# Patient Record
Sex: Female | Born: 1990 | Race: Black or African American | Hispanic: No | Marital: Married | Smoking: Current every day smoker
Health system: Southern US, Community
[De-identification: ages and names within clinical notes are randomized; demographics above are authoritative.]

## PROBLEM LIST (undated history)

## (undated) ENCOUNTER — Inpatient Hospital Stay (HOSPITAL_COMMUNITY): Payer: Self-pay

## (undated) DIAGNOSIS — O139 Gestational [pregnancy-induced] hypertension without significant proteinuria, unspecified trimester: Secondary | ICD-10-CM

## (undated) DIAGNOSIS — K219 Gastro-esophageal reflux disease without esophagitis: Secondary | ICD-10-CM

## (undated) HISTORY — PX: OTHER SURGICAL HISTORY: SHX169

## (undated) HISTORY — PX: NO PAST SURGERIES: SHX2092

---

## 2014-03-06 ENCOUNTER — Encounter (HOSPITAL_COMMUNITY): Payer: Self-pay | Admitting: Emergency Medicine

## 2014-03-06 ENCOUNTER — Emergency Department (HOSPITAL_COMMUNITY)
Admission: EM | Admit: 2014-03-06 | Discharge: 2014-03-06 | Disposition: A | Discharge status: Home / Self Care | Payer: Self-pay | Attending: Emergency Medicine | Admitting: Emergency Medicine

## 2014-03-06 DIAGNOSIS — Z3202 Encounter for pregnancy test, result negative: Secondary | ICD-10-CM | POA: Insufficient documentation

## 2014-03-06 DIAGNOSIS — B9689 Other specified bacterial agents as the cause of diseases classified elsewhere: Secondary | ICD-10-CM | POA: Insufficient documentation

## 2014-03-06 DIAGNOSIS — A499 Bacterial infection, unspecified: Secondary | ICD-10-CM | POA: Insufficient documentation

## 2014-03-06 DIAGNOSIS — N760 Acute vaginitis: Secondary | ICD-10-CM | POA: Insufficient documentation

## 2014-03-06 DIAGNOSIS — N949 Unspecified condition associated with female genital organs and menstrual cycle: Secondary | ICD-10-CM | POA: Insufficient documentation

## 2014-03-06 DIAGNOSIS — R109 Unspecified abdominal pain: Secondary | ICD-10-CM | POA: Insufficient documentation

## 2014-03-06 DIAGNOSIS — N938 Other specified abnormal uterine and vaginal bleeding: Secondary | ICD-10-CM | POA: Insufficient documentation

## 2014-03-06 LAB — URINALYSIS, ROUTINE W REFLEX MICROSCOPIC
BILIRUBIN URINE: NEGATIVE
Glucose, UA: NEGATIVE mg/dL
Ketones, ur: NEGATIVE mg/dL
Leukocytes, UA: NEGATIVE
Nitrite: NEGATIVE
Protein, ur: NEGATIVE mg/dL
SPECIFIC GRAVITY, URINE: 1.024 (ref 1.005–1.030)
UROBILINOGEN UA: 0.2 mg/dL (ref 0.0–1.0)
pH: 6.5 (ref 5.0–8.0)

## 2014-03-06 LAB — WET PREP, GENITAL
TRICH WET PREP: NONE SEEN
WBC, Wet Prep HPF POC: NONE SEEN
Yeast Wet Prep HPF POC: NONE SEEN

## 2014-03-06 LAB — URINE MICROSCOPIC-ADD ON

## 2014-03-06 LAB — POC URINE PREG, ED: PREG TEST UR: NEGATIVE

## 2014-03-06 MED ORDER — METRONIDAZOLE 500 MG PO TABS: 500.0000 mg | ORAL_TABLET | Freq: Two times a day (BID) | ORAL | Status: DC

## 2014-03-06 NOTE — ED Provider Notes (Signed)
CSN: [id number]     Arrival date & time 03/06/14  1805 History   First MD Initiated Contact with Patient 03/06/14 2224     Chief Complaint  Patient presents with  . Abdominal Cramping    HPI Patient reports to social uterine bleeding over the past several weeks.  She's concerned that she could be pregnant.  She also reports new vaginal discharge and pain with intercourse over the past week.  No new sexual partners.  She has unprotected sex.  Denies fevers and chills.  Denies nausea vomiting diarrhea.  Symptoms are mild in severity.  Nothing worsens or improves her symptoms   History reviewed. No pertinent past medical history. History reviewed. No pertinent past surgical history. No family history on file. History  Substance Use Topics  . Smoking status: Never Smoker   . Smokeless tobacco: Not on file  . Alcohol Use: No   OB History   Grav Para Term Preterm Abortions TAB SAB Ect Mult Living   1    1     2      Review of Systems  All other systems reviewed and are negative.     Allergies  Review of patient's allergies indicates no known allergies.  Home Medications   Prior to Admission medications   Medication Sig Start Date End Date Taking? Authorizing Provider  metroNIDAZOLE (FLAGYL) 500 MG tablet Take 1 tablet (500 mg total) by mouth 2 (two) times daily. 03/06/14   Lyanne CoKevin M Cortez Steelman, MD   BP 127/64  Pulse 69  Temp(Src) 98.2 F (36.8 C) (Oral)  Resp 15  Ht 5\' 5"  (1.651 m)  Wt 120 lb (54.432 kg)  BMI 19.97 kg/m2  SpO2 100%  LMP 02/12/2014 Physical Exam  Nursing note and vitals reviewed. Constitutional: She is oriented to person, place, and time. She appears well-developed and well-nourished. No distress.  HENT:  Head: Normocephalic and atraumatic.  Eyes: EOM are normal.  Neck: Normal range of motion.  Cardiovascular: Normal rate, regular rhythm and normal heart sounds.   Pulmonary/Chest: Effort normal and breath sounds normal.  Abdominal: Soft. She exhibits no  distension. There is no tenderness.  Genitourinary:  Normal external genitalia.  No cervical motion tenderness.  No adnexal masses or fullness.  Scant vaginal discharge.  No vaginal bleeding noted  Musculoskeletal: Normal range of motion.  Neurological: She is alert and oriented to person, place, and time.  Skin: Skin is warm and dry.  Psychiatric: She has a normal mood and affect. Judgment normal.    ED Course  Procedures (including critical care time) Labs Review Labs Reviewed  WET PREP, GENITAL - Abnormal; Notable for the following:    Clue Cells Wet Prep HPF POC RARE (*)    All other components within normal limits  URINALYSIS, ROUTINE W REFLEX MICROSCOPIC - Abnormal; Notable for the following:    APPearance CLOUDY (*)    Hgb urine dipstick LARGE (*)    All other components within normal limits  URINE MICROSCOPIC-ADD ON - Abnormal; Notable for the following:    Bacteria, UA FEW (*)    All other components within normal limits  GC/CHLAMYDIA PROBE AMP  POC URINE PREG, ED    Imaging Review No results found.   EKG Interpretation None      MDM   Final diagnoses:  Bacterial vaginosis  DUB (dysfunctional uterine bleeding)    Patient bacterial vaginosis.  Dysfunctional uterine bleeding.  Placed on Flagyl.  Home with Motrin and Tylenol.  We'll meds outpatient  clinic followup regarding her dysfunctional uterine bleeding.  Patient understands return to the ER for new or worsening symptoms no urinary symptoms.     Lyanne Co, MD 03/06/14 2312

## 2014-03-06 NOTE — ED Notes (Signed)
Pt reports lower abdominal cramping x 2 days. States she has also had intermittent vaginal bleeding. Reports 3 days late with menstrual cycle and concerned she could be pregnant. PT denies N/V/D. Denies any pain at this time. Pt reports urinary frequency x 1 week, denies hematuria, dysuria. NAD. LMP 7/24.

## 2014-03-08 LAB — GC/CHLAMYDIA PROBE AMP
CT Probe RNA: NEGATIVE
GC Probe RNA: NEGATIVE

## 2014-03-15 ENCOUNTER — Encounter (HOSPITAL_COMMUNITY): Payer: Self-pay | Admitting: Emergency Medicine

## 2014-03-15 ENCOUNTER — Emergency Department (HOSPITAL_COMMUNITY)
Admission: EM | Admit: 2014-03-15 | Discharge: 2014-03-16 | Disposition: A | Discharge status: Home / Self Care | Payer: Self-pay | Attending: Emergency Medicine | Admitting: Emergency Medicine

## 2014-03-15 DIAGNOSIS — N949 Unspecified condition associated with female genital organs and menstrual cycle: Secondary | ICD-10-CM | POA: Insufficient documentation

## 2014-03-15 DIAGNOSIS — R110 Nausea: Secondary | ICD-10-CM | POA: Insufficient documentation

## 2014-03-15 DIAGNOSIS — N938 Other specified abnormal uterine and vaginal bleeding: Secondary | ICD-10-CM | POA: Insufficient documentation

## 2014-03-15 DIAGNOSIS — Z792 Long term (current) use of antibiotics: Secondary | ICD-10-CM | POA: Insufficient documentation

## 2014-03-15 DIAGNOSIS — Z3202 Encounter for pregnancy test, result negative: Secondary | ICD-10-CM | POA: Insufficient documentation

## 2014-03-15 DIAGNOSIS — R350 Frequency of micturition: Secondary | ICD-10-CM | POA: Insufficient documentation

## 2014-03-15 DIAGNOSIS — N946 Dysmenorrhea, unspecified: Secondary | ICD-10-CM | POA: Insufficient documentation

## 2014-03-15 LAB — CBC WITH DIFFERENTIAL/PLATELET
BASOS ABS: 0 10*3/uL (ref 0.0–0.1)
Basophils Relative: 0 % (ref 0–1)
EOS ABS: 0.1 10*3/uL (ref 0.0–0.7)
EOS PCT: 1 % (ref 0–5)
HCT: 37 % (ref 36.0–46.0)
Hemoglobin: 13 g/dL (ref 12.0–15.0)
LYMPHS ABS: 4.1 10*3/uL — AB (ref 0.7–4.0)
Lymphocytes Relative: 53 % — ABNORMAL HIGH (ref 12–46)
MCH: 31.3 pg (ref 26.0–34.0)
MCHC: 35.1 g/dL (ref 30.0–36.0)
MCV: 88.9 fL (ref 78.0–100.0)
Monocytes Absolute: 0.6 10*3/uL (ref 0.1–1.0)
Monocytes Relative: 8 % (ref 3–12)
Neutro Abs: 2.9 10*3/uL (ref 1.7–7.7)
Neutrophils Relative %: 38 % — ABNORMAL LOW (ref 43–77)
PLATELETS: 264 10*3/uL (ref 150–400)
RBC: 4.16 MIL/uL (ref 3.87–5.11)
RDW: 12.2 % (ref 11.5–15.5)
WBC: 7.7 10*3/uL (ref 4.0–10.5)

## 2014-03-15 LAB — URINALYSIS, ROUTINE W REFLEX MICROSCOPIC
Bilirubin Urine: NEGATIVE
Glucose, UA: NEGATIVE mg/dL
KETONES UR: NEGATIVE mg/dL
Nitrite: NEGATIVE
Protein, ur: 100 mg/dL — AB
Specific Gravity, Urine: 1.029 (ref 1.005–1.030)
UROBILINOGEN UA: 0.2 mg/dL (ref 0.0–1.0)
pH: 5.5 (ref 5.0–8.0)

## 2014-03-15 LAB — WET PREP, GENITAL
CLUE CELLS WET PREP: NONE SEEN
Trich, Wet Prep: NONE SEEN
WBC, Wet Prep HPF POC: NONE SEEN
Yeast Wet Prep HPF POC: NONE SEEN

## 2014-03-15 LAB — BASIC METABOLIC PANEL
Anion gap: 13 (ref 5–15)
BUN: 14 mg/dL (ref 6–23)
CALCIUM: 9.3 mg/dL (ref 8.4–10.5)
CO2: 25 mEq/L (ref 19–32)
Chloride: 101 mEq/L (ref 96–112)
Creatinine, Ser: 0.72 mg/dL (ref 0.50–1.10)
GFR calc Af Amer: >90 mL/min (ref >90)
GLUCOSE: 98 mg/dL (ref 70–99)
Potassium: 3.8 mEq/L (ref 3.7–5.3)
Sodium: 139 mEq/L (ref 137–147)

## 2014-03-15 LAB — PREGNANCY, URINE: Preg Test, Ur: NEGATIVE

## 2014-03-15 LAB — URINE MICROSCOPIC-ADD ON

## 2014-03-15 MED ORDER — IBUPROFEN 400 MG PO TABS: 400.0000 mg | ORAL_TABLET | Freq: Four times a day (QID) | ORAL | Status: DC | PRN

## 2014-03-15 NOTE — Discharge Instructions (Signed)
Use Ibuprofen for pain.  Follow up with primary care physician (refer to resource guide).    Dysmenorrhea Menstrual cramps (dysmenorrhea) are caused by the muscles of the uterus tightening (contracting) during a menstrual period. For some women, this discomfort is merely bothersome. For others, dysmenorrhea can be severe enough to interfere with everyday activities for a few days each month. Primary dysmenorrhea is menstrual cramps that last a couple of days when you start having menstrual periods or soon after. This often begins after a teenager starts having her period. As a woman gets older or has a baby, the cramps will usually lessen or disappear. Secondary dysmenorrhea begins later in life, lasts longer, and the pain may be stronger than primary dysmenorrhea. The pain may start before the period and last a few days after the period.  CAUSES  Dysmenorrhea is usually caused by an underlying problem, such as:  The tissue lining the uterus grows outside of the uterus in other areas of the body (endometriosis).  The endometrial tissue, which normally lines the uterus, is found in or grows into the muscular walls of the uterus (adenomyosis).  The pelvic blood vessels are engorged with blood just before the menstrual period (pelvic congestive syndrome).  Overgrowth of cells (polyps) in the lining of the uterus or cervix.  Falling down of the uterus (prolapse) because of loose or stretched ligaments.  Depression.  Bladder problems, infection, or inflammation.  Problems with the intestine, a tumor, or irritable bowel syndrome.  Cancer of the female organs or bladder.  A severely tipped uterus.  A very tight opening or closed cervix.  Noncancerous tumors of the uterus (fibroids).  Pelvic inflammatory disease (PID).  Pelvic scarring (adhesions) from a previous surgery.  Ovarian cyst.  An intrauterine device (IUD) used for birth control. RISK FACTORS You may be at greater risk of  dysmenorrhea if:  You are younger than age 20.  You started puberty early.  You have irregular or heavy bleeding.  You have never given birth.  You have a family history of this problem.  You are a smoker. SIGNS AND SYMPTOMS   Cramping or throbbing pain in your lower abdomen.  Headaches.  Lower back pain.  Nausea or vomiting.  Diarrhea.  Sweating or dizziness.  Loose stools. DIAGNOSIS  A diagnosis is based on your history, symptoms, physical exam, diagnostic tests, or procedures. Diagnostic tests or procedures may include:  Blood tests.  Ultrasonography.  An examination of the lining of the uterus (dilation and curettage, D&C).  An examination inside your abdomen or pelvis with a scope (laparoscopy).  X-rays.  CT scan.  MRI.  An examination inside the bladder with a scope (cystoscopy).  An examination inside the intestine or stomach with a scope (colonoscopy, gastroscopy). TREATMENT  Treatment depends on the cause of the dysmenorrhea. Treatment may include:  Pain medicine prescribed by your health care provider.  Birth control pills or an IUD with progesterone hormone in it.  Hormone replacement therapy.  Nonsteroidal anti-inflammatory drugs (NSAIDs). These may help stop the production of prostaglandins.  Surgery to remove adhesions, endometriosis, ovarian cyst, or fibroids.  Removal of the uterus (hysterectomy).  Progesterone shots to stop the menstrual period.  Cutting the nerves on the sacrum that go to the female organs (presacral neurectomy).  Electric current to the sacral nerves (sacral nerve stimulation).  Antidepressant medicine.  Psychiatric therapy, counseling, or group therapy.  Exercise and physical therapy.  Meditation and yoga therapy.  Acupuncture. HOME CARE INSTRUCTIONS  Only take over-the-counter or prescription medicines as directed by your health care provider.  Place a heating pad or hot water bottle on your lower  back or abdomen. Do not sleep with the heating pad.  Use aerobic exercises, walking, swimming, biking, and other exercises to help lessen the cramping.  Massage to the lower back or abdomen may help.  Stop smoking.  Avoid alcohol and caffeine. SEEK MEDICAL CARE IF:   Your pain does not get better with medicine.  You have pain with sexual intercourse.  Your pain increases and is not controlled with medicines.  You have abnormal vaginal bleeding with your period.  You develop nausea or vomiting with your period that is not controlled with medicine. SEEK IMMEDIATE MEDICAL CARE IF:  You pass out.  Document Released: 07/04/2005 Document Revised: 03/06/2013 Document Reviewed: 12/20/2012 [facility] Patient Information 2015 [place], [place]. This information is not intended to replace advice given to you by your health care provider. Make sure you discuss any questions you have with your health care provider.

## 2014-03-15 NOTE — ED Notes (Signed)
Pt arrived to the Ed with a complaint of vaginal bleeding.  Pt states that she missed her period and has started bleeding yesterday evening.  Pt has associated cramping as well.  Pt states she has been going through 5 pads daily

## 2014-03-15 NOTE — ED Provider Notes (Signed)
CSN: [id number]     Arrival date & time 03/15/14  2131 History   First MD Initiated Contact with Patient 03/15/14 2153     Chief Complaint  Patient presents with  . Vaginal Bleeding     (Consider location/radiation/quality/duration/timing/severity/associated sxs/prior Treatment) HPI Patient is a 23 year old female presenting to the ER tonight with a complaint of vaginal bleeding and lower abdominal cramping. Patient states the cramping sensation began yesterday evening which was soon followed by a steady, slow flow of vaginal bleeding. Patient states she's used approximately 5 pads for the bleed in the past 24 hours. Patient describes her pain as a "constant, cramping" pain. Patient states there are no aggravating or alleviating factors for her pain. Patient states she has not tried taking any medication for pain. Patient references her pain in her suprapubic region. Patient reports some mild associated nausea. Patient denies any associated fever, chills, vomiting, diarrhea, shortness of breath, chest pain, dizziness, palpitations, dysuria, vaginal discharge. Patient states she is sexually active reports having one partner, does not use any form of contraception or protection. Patient states her menstrual cycle has been irregular this year and her last menstrual period ended on 02/07/14, approximately one month ago.  History reviewed. No pertinent past medical history. History reviewed. No pertinent past surgical history. History reviewed. No pertinent family history. History  Substance Use Topics  . Smoking status: Never Smoker   . Smokeless tobacco: Not on file  . Alcohol Use: No   OB History   Grav Para Term Preterm Abortions TAB SAB Ect Mult Living   Review of Systems  Constitutional: Negative for fever.  HENT: Negative for trouble swallowing.   Eyes: Negative for visual disturbance.  Respiratory: Negative for shortness of breath.   Cardiovascular: Negative for  chest pain.  Gastrointestinal: Positive for nausea. Negative for vomiting and abdominal pain.  Genitourinary: Positive for frequency and vaginal bleeding. Negative for dysuria, vaginal discharge and vaginal pain.  Musculoskeletal: Negative for neck pain.  Skin: Negative for rash.  Neurological: Negative for dizziness, weakness, light-headedness and numbness.  Psychiatric/Behavioral: Negative.       Allergies  Review of patient's allergies indicates no known allergies.  Home Medications   Prior to Admission medications   Medication Sig Start Date End Date Taking? Authorizing Provider  ibuprofen (ADVIL,MOTRIN) 400 MG tablet Take 1 tablet (400 mg total) by mouth every 6 (six) hours as needed. 03/15/14   Monte Fantasia, PA-C  metroNIDAZOLE (FLAGYL) 500 MG tablet Take 1 tablet (500 mg total) by mouth 2 (two) times daily. 03/06/14   Lyanne Co, MD   BP 116/73  Pulse 63  Temp(Src) 98.7 F (37.1 C) (Oral)  Resp 18  SpO2 100%  LMP 01/15/2014 Physical Exam  Nursing note and vitals reviewed. Constitutional: She is oriented to person, place, and time. She appears well-developed and well-nourished. No distress.  HENT:  Head: Normocephalic and atraumatic.  Mouth/Throat: Oropharynx is clear and moist. No oropharyngeal exudate.  Eyes: Pupils are equal, round, and reactive to light. Right eye exhibits no discharge. Left eye exhibits no discharge. No scleral icterus.  Neck: Normal range of motion.  Cardiovascular: Normal rate, regular rhythm and normal heart sounds.   No murmur heard. Pulmonary/Chest: Effort normal and breath sounds normal. No respiratory distress.  Abdominal: Soft. Normal appearance and bowel sounds are normal. There is tenderness in the suprapubic area. There is no rigidity, no rebound, no  guarding, no tenderness at McBurney's point and negative Murphy's sign.  Genitourinary: Pelvic exam was performed with patient prone. There is no rash, tenderness, lesion or injury on the  right labia. There is no rash, tenderness, lesion or injury on the left labia. There is tenderness and bleeding around the vagina. No erythema around the vagina. No foreign body around the vagina. No signs of injury around the vagina. No vaginal discharge found.  Moderate amount of blood noted in vaginal vault. Cervical os closed. Mild adnexal tenderness noted bilaterally. Chaperone present during entire pelvic exam.   Musculoskeletal: Normal range of motion. She exhibits no edema and no tenderness.  Neurological: She is alert and oriented to person, place, and time. She has normal strength. No cranial nerve deficit. Coordination normal.  Skin: Skin is warm and dry. No rash noted. She is not diaphoretic.  Psychiatric: She has a normal mood and affect.    ED Course  Procedures (including critical care time) Labs Review Labs Reviewed  URINALYSIS, ROUTINE W REFLEX MICROSCOPIC - Abnormal; Notable for the following:    Color, Urine RED (*)    APPearance TURBID (*)    Hgb urine dipstick LARGE (*)    Protein, ur 100 (*)    Leukocytes, UA SMALL (*)    All other components within normal limits  CBC WITH DIFFERENTIAL - Abnormal; Notable for the following:    Neutrophils Relative % 38 (*)    Lymphocytes Relative 53 (*)    Lymphs Abs 4.1 (*)    All other components within normal limits  WET PREP, GENITAL  GC/CHLAMYDIA PROBE AMP  PREGNANCY, URINE  BASIC METABOLIC PANEL  URINE MICROSCOPIC-ADD ON    Imaging Review No results found.   EKG Interpretation None      MDM   Final diagnoses:  Menses painful    23 year old female presenting to night after approximately 24 hours of lower abdominal "cramping" and vaginal bleeding. Patient states she's used approximately 5 pads in the past 24 hours. Reporting mild nausea, no vomiting. Denies fevers chills, chest pain, shortness of breath. Patient reports being sexually active, no new partners. Patient does not use any form of contraception. LMP  02/07/14.     11:45 PM: Pelvic exam remarkable for moderate amount of blood in vaginal vault and mild adnexal tenderness. Pregnancy test negative. Patient sitting upright in bed, speaking in full, clear sentences, in no acute distress. Due to the fact the patient is not pregnant, she has been having irregular menses, and her last menstrual period was approximately one month ago, her vaginal bleed is most likely due to her normal menses. Based on physical exam findings of moderate vaginal bleed with mild suprapubic tenderness and mild bilateral adnexal tenderness with no point tenderness, and the nature of her cramping pain, her presentation is not consistent with an ovarian torsion. We will discharge patient and have her followup with her PCP. Patient take NSAIDs over-the-counter for pain. Patient is agreeable to this plan. We encouraged patient to call or return to the ER should her symptoms return, worsen or should she have any questions or concerns.  Filed Vitals:   03/16/14 0005  BP: 116/73  Pulse: 63  Temp:   Resp: 18   Signed,  Ladona Mow, PA-C 2:10 AM   This patient seen and discussed with Dr. Cathren Laine, MD     Monte Fantasia, PA-C 03/16/14 0210

## 2014-03-16 NOTE — ED Notes (Signed)
Patient is alert and oriented x3.  She was given DC instructions and follow up visit instructions.  Patient gave verbal understanding. She was DC ambulatory under her own power to home.  V/S stable.  He was not showing any signs of distress on DC 

## 2014-03-17 NOTE — ED Provider Notes (Signed)
Results for orders placed during the hospital encounter of 03/15/14  WET PREP, GENITAL      Result Value Ref Range   Yeast Wet Prep HPF POC NONE SEEN  NONE SEEN   Trich, Wet Prep NONE SEEN  NONE SEEN   Clue Cells Wet Prep HPF POC NONE SEEN  NONE SEEN   WBC, Wet Prep HPF POC NONE SEEN  NONE SEEN  PREGNANCY, URINE      Result Value Ref Range   Preg Test, Ur NEGATIVE  NEGATIVE  URINALYSIS, ROUTINE W REFLEX MICROSCOPIC      Result Value Ref Range   Color, Urine RED (*) YELLOW   APPearance TURBID (*) CLEAR   Specific Gravity, Urine 1.029  1.005 - 1.030   pH 5.5  5.0 - 8.0   Glucose, UA NEGATIVE  NEGATIVE mg/dL   Hgb urine dipstick LARGE (*) NEGATIVE   Bilirubin Urine NEGATIVE  NEGATIVE   Ketones, ur NEGATIVE  NEGATIVE mg/dL   Protein, ur 161 (*) NEGATIVE mg/dL   Urobilinogen, UA 0.2  0.0 - 1.0 mg/dL   Nitrite NEGATIVE  NEGATIVE   Leukocytes, UA SMALL (*) NEGATIVE  CBC WITH DIFFERENTIAL      Result Value Ref Range   WBC 7.7  4.0 - 10.5 K/uL   RBC 4.16  3.87 - 5.11 MIL/uL   Hemoglobin 13.0  12.0 - 15.0 g/dL   HCT 09.6  04.5 - 40.9 %   MCV 88.9  78.0 - 100.0 fL   MCH 31.3  26.0 - 34.0 pg   MCHC 35.1  30.0 - 36.0 g/dL   RDW 81.1  91.4 - 78.2 %   Platelets 264  150 - 400 K/uL   Neutrophils Relative % 38 (*) 43 - 77 %   Neutro Abs 2.9  1.7 - 7.7 K/uL   Lymphocytes Relative 53 (*) 12 - 46 %   Lymphs Abs 4.1 (*) 0.7 - 4.0 K/uL   Monocytes Relative 8  3 - 12 %   Monocytes Absolute 0.6  0.1 - 1.0 K/uL   Eosinophils Relative 1  0 - 5 %   Eosinophils Absolute 0.1  0.0 - 0.7 K/uL   Basophils Relative 0  0 - 1 %   Basophils Absolute 0.0  0.0 - 0.1 K/uL  BASIC METABOLIC PANEL      Result Value Ref Range   Sodium 139  137 - 147 mEq/L   Potassium 3.8  3.7 - 5.3 mEq/L   Chloride 101  96 - 112 mEq/L   CO2 25  19 - 32 mEq/L   Glucose, Bld 98  70 - 99 mg/dL   BUN 14  6 - 23 mg/dL   Creatinine, Ser 9.56  0.50 - 1.10 mg/dL   Calcium 9.3  8.4 - 21.3 mg/dL   GFR calc non Af Amer >90  >90  mL/min   GFR calc Af Amer >90  >90 mL/min   Anion gap 13  5 - 15  URINE MICROSCOPIC-ADD ON      Result Value Ref Range   RBC / HPF TOO NUMEROUS TO COUNT  <3 RBC/hpf   Urine-Other URINALYSIS PERFORMED ON SUPERNATANT     Pt c/o vaginal bleeding. No faintness or dizziness. Intermittent cramping. abd soft nt. u preg.       Suzi Roots, MD 03/17/14 [phone]

## 2014-03-18 LAB — GC/CHLAMYDIA PROBE AMP
CT Probe RNA: NEGATIVE
GC Probe RNA: NEGATIVE

## 2014-05-20 ENCOUNTER — Encounter (HOSPITAL_COMMUNITY): Payer: Self-pay | Admitting: Emergency Medicine

## 2014-06-06 ENCOUNTER — Ambulatory Visit: Payer: Medicaid Other | Admitting: Obstetrics

## 2014-07-07 ENCOUNTER — Encounter (HOSPITAL_COMMUNITY): Payer: Self-pay | Admitting: Emergency Medicine

## 2014-07-07 ENCOUNTER — Emergency Department (HOSPITAL_COMMUNITY)
Admission: EM | Admit: 2014-07-07 | Discharge: 2014-07-07 | Disposition: A | Discharge status: Home / Self Care | Payer: Medicaid Other | Attending: Emergency Medicine | Admitting: Emergency Medicine

## 2014-07-07 DIAGNOSIS — Z3202 Encounter for pregnancy test, result negative: Secondary | ICD-10-CM | POA: Diagnosis not present

## 2014-07-07 DIAGNOSIS — F419 Anxiety disorder, unspecified: Secondary | ICD-10-CM | POA: Diagnosis present

## 2014-07-07 LAB — URINALYSIS, ROUTINE W REFLEX MICROSCOPIC
BILIRUBIN URINE: NEGATIVE
Glucose, UA: NEGATIVE mg/dL
Ketones, ur: NEGATIVE mg/dL
Leukocytes, UA: NEGATIVE
NITRITE: NEGATIVE
PROTEIN: 30 mg/dL — AB
SPECIFIC GRAVITY, URINE: 1.027 (ref 1.005–1.030)
UROBILINOGEN UA: 1 mg/dL (ref 0.0–1.0)
pH: 7 (ref 5.0–8.0)

## 2014-07-07 LAB — URINE MICROSCOPIC-ADD ON

## 2014-07-07 LAB — PREGNANCY, URINE: Preg Test, Ur: NEGATIVE

## 2014-07-07 MED ORDER — LORAZEPAM 0.5 MG PO TABS: 0.5000 mg | ORAL_TABLET | Freq: Two times a day (BID) | ORAL | Status: DC | PRN

## 2014-07-07 NOTE — ED Notes (Addendum)
Pt transported from home with c/o increased anxiety/depression over last few days, cites multiple stressors in her life. A & O  Pt tearful in triage, stating she awoke in the middle of the night and was confused on what time it was and then began thinking about [place], her and boyfriend have no support, she is only one working in home and she is unsure on how to provide financially and added stress of christmas coming for 743 and 23 year old children

## 2014-07-07 NOTE — ED Provider Notes (Signed)
CSN: [id number]     Arrival date & time 07/07/14  0424 History   First MD Initiated Contact with Patient 07/07/14 [phone]     Chief Complaint  Patient presents with  . Anxiety     (Consider location/radiation/quality/duration/timing/severity/associated sxs/prior Treatment) Patient is a 23 y.o. female presenting with anxiety. The history is provided by the patient. No language interpreter was used.  Anxiety This is a recurrent problem. The current episode started today. Pertinent negatives include no chills or fever. Associated symptoms comments: She complains of depression and stress, waking early this morning frantic, crying and feeling agitated. She was previously treated with Xanax for anxiety but reports she has not taken any medication in months. No SI/HI, substance abuse issues. She is feeling much better than when symptoms started earlier this evening. [name].    History reviewed. No pertinent past medical history. History reviewed. No pertinent past surgical history. No family history on file. History  Substance Use Topics  . Smoking status: Never Smoker   . Smokeless tobacco: Not on file  . Alcohol Use: No   OB History    Gravida Para Term Preterm AB TAB SAB Ectopic Multiple Living   1    1     2      Review of Systems  Constitutional: Negative for fever and chills.  HENT: Negative.   Respiratory: Negative.   Cardiovascular: Negative.   Gastrointestinal: Negative.   Musculoskeletal: Negative.   Skin: Negative.   Neurological: Negative.   Psychiatric/Behavioral: Positive for dysphoric mood and agitation. Negative for suicidal ideas. The patient is nervous/anxious.       Allergies  Review of patient's allergies indicates no known allergies.  Home Medications   Prior to Admission medications   Medication Sig Start Date End Date Taking? Authorizing Provider  ibuprofen (ADVIL,MOTRIN) 400 MG tablet Take 1 tablet (400 mg total) by mouth every 6 (six) hours as  needed. Patient not taking: Reported on 07/07/2014 03/15/14   Monte FantasiaJoseph W Mintz, PA-C  metroNIDAZOLE (FLAGYL) 500 MG tablet Take 1 tablet (500 mg total) by mouth 2 (two) times daily. Patient not taking: Reported on 07/07/2014 03/06/14   Lyanne CoKevin M Campos, MD   BP 133/88 mmHg  Pulse 89  Temp(Src) 98.3 F (36.8 C) (Oral)  Resp 18  Ht 5\' 5"  (1.651 m)  Wt 125 lb (56.7 kg)  BMI 20.80 kg/m2  SpO2 100%  LMP 06/27/2014 Physical Exam  Constitutional: She is oriented to person, place, and time. She appears well-developed and well-nourished.  HENT:  Head: Normocephalic.  Neck: Normal range of motion. Neck supple.  Cardiovascular: Normal rate and regular rhythm.   Pulmonary/Chest: Effort normal and breath sounds normal.  Abdominal: Soft. Bowel sounds are normal. There is no tenderness. There is no rebound and no guarding.  Musculoskeletal: Normal range of motion.  Neurological: She is alert and oriented to person, place, and time.  Skin: Skin is warm and dry. No rash noted.  Psychiatric: She has a normal mood and affect. Her behavior is normal. Judgment and thought content normal.    ED Course  Procedures (including critical care time) Labs Review Labs Reviewed  PREGNANCY, URINE  URINALYSIS, ROUTINE W REFLEX MICROSCOPIC    Imaging Review No results found.   EKG Interpretation None      MDM   Final diagnoses:  None    1. Stress anxiety  She reports symptoms of malodorous urine without dysuria, frequency, fever or nausea. UA negative.   Discussed outpatient referrals for management  of symptoms. Will Rx Ativan (#5 pills only) and encourage outpatient follow up.    Arnoldo HookerShari A Shironda Kain, PA-C 07/07/14 [id number]  Ward GivensIva L Knapp, MD 07/07/14 [phone]

## 2014-07-07 NOTE — ED Notes (Signed)
Bed: [facility] Expected date:  Expected time:  Means of arrival:  Comments: EMS anxiety 23 yo F

## 2014-07-07 NOTE — Discharge Instructions (Signed)
Panic Attacks °Panic attacks are sudden, short-lived surges of severe anxiety, fear, or discomfort. They may occur for no reason when you are relaxed, when you are anxious, or when you are sleeping. Panic attacks may occur for a number of reasons:  °· Healthy people occasionally have panic attacks in extreme, life-threatening situations, such as war or natural disasters. Normal anxiety is a protective mechanism of the body that helps us react to danger (fight or flight response). °· Panic attacks are often seen with anxiety disorders, such as panic disorder, social anxiety disorder, generalized anxiety disorder, and phobias. Anxiety disorders cause excessive or uncontrollable anxiety. They may interfere with your relationships or other life activities. °· Panic attacks are sometimes seen with other mental illnesses, such as depression and posttraumatic stress disorder. °· Certain medical conditions, prescription medicines, and drugs of abuse can cause panic attacks. °SYMPTOMS  °Panic attacks start suddenly, peak within 20 minutes, and are accompanied by four or more of the following symptoms: °· Pounding heart or fast heart rate (palpitations). °· Sweating. °· Trembling or shaking. °· Shortness of breath or feeling smothered. °· Feeling choked. °· Chest pain or discomfort. °· Nausea or strange feeling in your stomach. °· Dizziness, light-headedness, or feeling like you will faint. °· Chills or hot flushes. °· Numbness or tingling in your lips or hands and feet. °· Feeling that things are not real or feeling that you are not yourself. °· Fear of losing control or going crazy. °· Fear of dying. °Some of these symptoms can mimic serious medical conditions. For example, you may think you are having a heart attack. Although panic attacks can be very scary, they are not life threatening. °DIAGNOSIS  °Panic attacks are diagnosed through an assessment by your health care provider. Your health care provider will ask  questions about your symptoms, such as where and when they occurred. Your health care provider will also ask about your medical history and use of alcohol and drugs, including prescription medicines. Your health care provider may order blood tests or other studies to rule out a serious medical condition. Your health care provider may refer you to a mental health professional for further evaluation. °TREATMENT  °· Most healthy people who have one or two panic attacks in an extreme, life-threatening situation will not require treatment. °· The treatment for panic attacks associated with anxiety disorders or other mental illness typically involves counseling with a mental health professional, medicine, or a combination of both. Your health care provider will help determine what treatment is best for you. °· Panic attacks due to physical illness usually go away with treatment of the illness. If prescription medicine is causing panic attacks, talk with your health care provider about stopping the medicine, decreasing the dose, or substituting another medicine. °· Panic attacks due to alcohol or drug abuse go away with abstinence. Some adults need professional help in order to stop drinking or using drugs. °HOME CARE INSTRUCTIONS  °· Take all medicines as directed by your health care provider.   °· Schedule and attend follow-up visits as directed by your health care provider. It is important to keep all your appointments. °SEEK MEDICAL CARE IF: °· You are not able to take your medicines as prescribed. °· Your symptoms do not improve or get worse. °SEEK IMMEDIATE MEDICAL CARE IF:  °· You experience panic attack symptoms that are different than your usual symptoms. °· You have serious thoughts about hurting yourself or others. °· You are taking medicine for panic attacks and   have a serious side effect. °MAKE SURE YOU: °· Understand these instructions. °· Will watch your condition. °· Will get help right away if you are not  doing well or get worse. °Document Released: 07/04/2005 Document Revised: 07/09/2013 Document Reviewed: 02/15/2013 °ExitCare® Patient Information ©2015 [employer]. This information is not intended to replace advice given to you by your health care provider. Make sure you discuss any questions you have with your health care provider. ° °Emergency Department Resource Guide °1) Find a Doctor and Pay Out of Pocket °Although you won't have to find out who is covered by your insurance plan, it is a good idea to ask around and get recommendations. You will then need to call the office and see if the doctor you have chosen will accept you as a new patient and what types of options they offer for patients who are self-pay. Some doctors offer discounts or will set up payment plans for their patients who do not have insurance, but you will need to ask so you aren't surprised when you get to your appointment. ° °2) Contact Your Local Health Department °Not all health departments have doctors that can see patients for sick visits, but many do, so it is worth a call to see if yours does. If you don't know where your local health department is, you can check in your phone book. The CDC also has a tool to help you locate your state's health department, and many state websites also have listings of all of their local health departments. ° °3) Find a Walk-in Clinic °If your illness is not likely to be very severe or complicated, you may want to try a walk in clinic. These are popping up all over the country in pharmacies, drugstores, and shopping centers. They're usually staffed by nurse practitioners or physician assistants that have been trained to treat common illnesses and complaints. They're usually fairly quick and inexpensive. However, if you have serious medical issues or chronic medical problems, these are probably not your best option. ° °No Primary Care Doctor: °- Call [facility] at  832-8000 - they can help you  locate a primary care doctor that  accepts your insurance, provides certain services, etc. °- Physician Referral Service- 1-800-533-3463 ° °Chronic Pain Problems: °Organization         Address  Phone   Notes  ° [facility]  (336) 297-2271 Patients need to be referred by their primary care doctor.  ° °Medication Assistance: °Organization         Address  Phone   Notes  °[place] [facility] [address] °[place], [place] [address] (336) 641-8030 --Must be a resident of [place] °-- Must have NO insurance coverage whatsoever (no Medicaid/ Medicare, etc.) °-- The pt. MUST have a primary care doctor that directs their care regularly and follows them in the community °  °[facility]  (866) 331-1348   °[employer]  (888) 892-1162   ° °Agencies that provide inexpensive medical care: °Organization         Address  Phone   Notes  °[facility]  (336) 832-8035   °[facility]    (336) 832-7272   °[facility] [address] °[place], [place] [address] (336) 832-4777   °[facility] [address], °[place] (336) 271-4999   °[facility]    (336) 373-0678   °[facility]    (336) 272-1050   °  [facility] ° [address], [place] Phone:  (336) 832-4444, Fax:  (336) 832-4440 Hours of Operation:  9 am - 6 pm, M-F.  Also accepts Medicaid/Medicare and self-pay.  °[facility] ° [address], [place] Phone: (336) 832-3150, Fax: (336) 832-3151. Hours of Operation:  8:30 am - 5:30 pm, M-F.  Also accepts Medicaid and self-pay.  °[facility] [place] [address], [place] Phone: (336) 878-6027   °[facility] [address], [place], [place] (336)723-1848, Ext. 123 Mondays & Thursdays: 7-9 AM.  First 15 patients are seen on a first come, first serve basis. °  ° °Medicaid-accepting [place]  Providers: ° °Organization         Address  Phone   Notes  °[facility] [address], [place] (336) 641-2100 Also accepts self-pay patients.  °[facility] [address], [place] ° (336) 856-9996   °[facility] [address], [place] (336) 288-8857   °[facility] [address] [place] [address], [place] (336) 299-7000   °Veita Bland [address], [place]  ° (336) 373-1557 Only accepts [place] Access Medicaid patients after they have their name applied to their card.  ° °Self-Pay (no insurance) in [place]: ° °Organization         Address  Phone   Notes  °Sickle Cell Patients, [facility] [address], [place] (336) 832-1970   °[facility] [address], [place] (336) 832-4400   °[facility] ° [address], [place] (336) 992-4800   °[facility]/Dr. Osei-Bonsu ° [address] [place] [address], [place] or [address], [place] (336) 841-8500 Phone number for both [place] and [place] locations is the same.  °[facility] [address], [place] (336) 299-0000   °[facility] [address] [place] [address], [place] or [address] (336) 852-7530 °(336) 878-2260   °[facility] [address], [place] (336) 350-1642, phone; (336) 294-5005, fax Sees patients 1st and 3rd Saturday of every month.  Must not qualify for public or private insurance (i.e. Medicaid, Medicare, [place] Health Choice, Veterans' Benefits) • Household income should be no more than 200% of the poverty level •The clinic cannot treat you if you are pregnant or think you are pregnant • Sexually transmitted diseases are not treated at the clinic.  ° ° °Dental Care: °Organization         Address  Phone  Notes  °[facility] [address], [place] (336) 641-6152 Accepts children up to age 21 who are enrolled in Medicaid or [place] Health Choice; pregnant women with a Medicaid card; and children who have applied for Medicaid or [place] Health Choice, but were declined, whose parents can pay a reduced fee at time of service.  °[facility] [place]  [address], [place] (336) 641-7733 Accepts children up to age 21 who are enrolled in Medicaid or [place] Health Choice; pregnant women with a Medicaid card; and children who have applied for Medicaid or [place] Health Choice, but were declined, whose parents can pay a reduced fee at time of service.  °[facility] ° [address], [place] (336) 641-4533 Patients are seen by appointment only. Walk-ins are not accepted. [facility] will see patients 18 years of age and older. °Monday - Tuesday (8am-5pm) °Most Wednesdays (8:30-5pm) °$30 per visit, cash only  °[facility] ° [address], [place] (336) 641-4533 Patients are seen by appointment only. Walk-ins are not accepted. [facility] will see patients 18 years of age and older. °One Wednesday Evening (Monthly: Volunteer Based).  $30 per visit, cash only  °[facility]  (919) 537-3737 for adults; Children under age 4, call Graduate Pediatric Dentistry at ([phone]) [phone]. Children aged 4-14, please call ([phone]) [phone] to request a pediatric application. ° Dental services are provided in all areas of dental care including fillings, crowns and bridges, complete and partial dentures, implants, gum treatment, root canals, and extractions. Preventive care is also provided. Treatment is provided to both adults and children. °Patients are selected via a lottery and there is often a waiting list. °  °[facility] [address], °[place] ° (336) 763-8833 [email or web address] °  °[facility]  [address], [place], [place] (336)723-1848, Ext. 123 Second and Fourth Thursday of each month, opens at 6:30 AM; Clinic ends at 9 AM.  Patients are seen on a first-come first-served basis, and a limited number are seen during each clinic.  ° °[facility] ° [address], [place], [place] (336) 723-7904   Eligibility Requirements °You must have lived in [place], or [place] counties for at least the last three months. °  You cannot be eligible for state or federal sponsored healthcare insurance, including Veterans Administration, Medicaid, or Medicare. °  You generally cannot be eligible for healthcare insurance through your employer.  °  How to apply: °Eligibility screenings are held every Tuesday and Wednesday afternoon from 1:00 pm until 4:00 pm. You do not need an appointment for the interview!  °[facility] [address], [place], [place] 336-631-2330   °[facility]  336-342-8273   °[facility]  336-703-3100   °[facility]  336-570-6415   ° °Behavioral Health Resources in the Community: °Intensive Outpatient Programs °Organization         Address  Phone  Notes  °[facility] [address], [place], [place] 336-878-6098   °[facility] [address], [place], [place] 336-832-9800   °[facility] [address], [place], [place] ° 336-882-2125   °[facility] [address],  °[place], [place] 1-800-853-5163 or 336-641-4981   °Substance Abuse Resources °Organization         Address  Phone  Notes  °[facility]  336-882-2125   °[facility]  336-784-9470   °[facility]  336-285-9073   °[facility]  336-845-3988   °Residential & Outpatient Substance Abuse Program  1-800-659-3381   °Psychological Services °Organization         Address  Phone  Notes  °[facility]  336- 832-9600     °[facility]  336- 378-7881   °[facility] [address], [place] 1-800-853-5163 or 336-641-4981   ° °Mobile Crisis Teams °Organization         Address  Phone  Notes  °[facility], Mobile Crisis Care Unit  1-877-626-1772   °Assertive °[facility] ° [address] , [place] 336-834-9664   °  Sharon DeEsch [address] °[place] 336-554-5454   ° °Self-Help/Support Groups °Organization         Address  Phone             Notes  °[facility] - variety of support groups  336- 373-1402 Call for more information  °Narcotics Anonymous (NA), [facility] [address], °[place]  2 meetings at this location  ° °Residential Treatment Programs °Organization         Address  Phone  Notes  °[facility] [address],    °[place]  1-866-801-8205   °[facility] ° [address], [place], [place] 704-293-8524   °[facility] [address], [place] 336-845-3988 Admissions: 8am-3pm M-F  °[facility] [address]    °[place], [place] 336-841-1104   °[facility] [address], [place], [place] 336-379-7146   °[facility] [address]  °[place], [place] 336-285-9073   °[facility] Intensive Outpatient [address], [place], [place] 336-852-3033   °[facility] [address]  °[place], [place] 1-877-615-2722 or 336-784-9470   °[facility] [address] [place], [place] 336-227-7417 Accepts Medicaid  °[facility] [address]  °[place] 1-800-659-3381 Substance Abuse/Addiction Treatment  ° °[place] Behavioral Health Resources °Organization         Address  Phone  Notes  °[facility]  (888) 581-9988   °Julie Brannon, PhD [address] [place], [place]   (336) 349-5553 or (336) 951-0000    °[facility]   [address] °[place], [place] (336) 349-4454   °[facility] [address], [place], [place] (336) 342-8316 Insurance/Medicaid/sponsorship through [facility]  °[facility] [address]                                    [place], [place] (336) 342-8316 Therapy/tele-psych/case  °[facility] [address]  ° [place],  (336) 349-2233    °Dr. Arfeen  (336) 349-4544   °[facility] [place]  [employer] [place] [facility] 1) [address], [place] °2) [address], [place] °3)  [address], [place] (336) 349-3220 °(336) 342-7768 ° °(336) 342-8140   °[place] [facility] (336) 342-1394 or (336) 342-3537 (After Hours)    ° ° ° °

## 2014-07-18 NOTE — L&D Delivery Note (Signed)
Delivery Note At 12:48 PM a viable female was delivered via  (Pre3-vesselsentation: LOA ;  ).  APGAR: 8-10 , ; weight: 3000gms .   Placenta status: Spontaneous, intact, .  Cord:   with the following complications: .  Cord pH: none  Anesthesia:  Epidural Episiotomy:  None Lacerations: None Suture Repair: none Est. Blood Loss (mL):  350  Mom to postpartum.  Baby to Couplet care / Skin to Skin.  HARPER,CHARLES A 07/13/2015, 2:01 PM

## 2014-11-10 ENCOUNTER — Encounter (HOSPITAL_COMMUNITY): Payer: Self-pay

## 2014-11-10 ENCOUNTER — Emergency Department (HOSPITAL_COMMUNITY): Payer: Medicaid Other

## 2014-11-10 ENCOUNTER — Emergency Department (HOSPITAL_COMMUNITY)
Admission: EM | Admit: 2014-11-10 | Discharge: 2014-11-10 | Disposition: A | Discharge status: Home / Self Care | Payer: Medicaid Other | Attending: Emergency Medicine | Admitting: Emergency Medicine

## 2014-11-10 DIAGNOSIS — R1032 Left lower quadrant pain: Secondary | ICD-10-CM | POA: Diagnosis not present

## 2014-11-10 DIAGNOSIS — R109 Unspecified abdominal pain: Secondary | ICD-10-CM

## 2014-11-10 DIAGNOSIS — Z792 Long term (current) use of antibiotics: Secondary | ICD-10-CM | POA: Insufficient documentation

## 2014-11-10 DIAGNOSIS — Z3A01 Less than 8 weeks gestation of pregnancy: Secondary | ICD-10-CM | POA: Insufficient documentation

## 2014-11-10 DIAGNOSIS — O9989 Other specified diseases and conditions complicating pregnancy, childbirth and the puerperium: Secondary | ICD-10-CM | POA: Diagnosis present

## 2014-11-10 DIAGNOSIS — R197 Diarrhea, unspecified: Secondary | ICD-10-CM | POA: Diagnosis not present

## 2014-11-10 DIAGNOSIS — O26899 Other specified pregnancy related conditions, unspecified trimester: Secondary | ICD-10-CM

## 2014-11-10 LAB — BASIC METABOLIC PANEL
Anion gap: 8 (ref 5–15)
BUN: 7 mg/dL (ref 6–23)
CALCIUM: 9.2 mg/dL (ref 8.4–10.5)
CO2: 25 mmol/L (ref 19–32)
CREATININE: 0.54 mg/dL (ref 0.50–1.10)
Chloride: 103 mmol/L (ref 96–112)
GFR calc non Af Amer: >90 mL/min (ref >90)
Glucose, Bld: 80 mg/dL (ref 70–99)
Potassium: 3.7 mmol/L (ref 3.5–5.1)
SODIUM: 136 mmol/L (ref 135–145)

## 2014-11-10 LAB — URINE MICROSCOPIC-ADD ON

## 2014-11-10 LAB — CBC WITH DIFFERENTIAL/PLATELET
BASOS ABS: 0 10*3/uL (ref 0.0–0.1)
Basophils Relative: 0 % (ref 0–1)
EOS PCT: 1 % (ref 0–5)
Eosinophils Absolute: 0 10*3/uL (ref 0.0–0.7)
HCT: 39.7 % (ref 36.0–46.0)
Hemoglobin: 13.8 g/dL (ref 12.0–15.0)
Lymphocytes Relative: 30 % (ref 12–46)
Lymphs Abs: 2.4 10*3/uL (ref 0.7–4.0)
MCH: 31.9 pg (ref 26.0–34.0)
MCHC: 34.8 g/dL (ref 30.0–36.0)
MCV: 91.9 fL (ref 78.0–100.0)
MONO ABS: 0.5 10*3/uL (ref 0.1–1.0)
MONOS PCT: 7 % (ref 3–12)
Neutro Abs: 5 10*3/uL (ref 1.7–7.7)
Neutrophils Relative %: 62 % (ref 43–77)
PLATELETS: 247 10*3/uL (ref 150–400)
RBC: 4.32 MIL/uL (ref 3.87–5.11)
RDW: 12.4 % (ref 11.5–15.5)
WBC: 8 10*3/uL (ref 4.0–10.5)

## 2014-11-10 LAB — URINALYSIS, ROUTINE W REFLEX MICROSCOPIC
Bilirubin Urine: NEGATIVE
GLUCOSE, UA: NEGATIVE mg/dL
KETONES UR: NEGATIVE mg/dL
Leukocytes, UA: NEGATIVE
NITRITE: POSITIVE — AB
Protein, ur: NEGATIVE mg/dL
SPECIFIC GRAVITY, URINE: 1.021 (ref 1.005–1.030)
Urobilinogen, UA: 0.2 mg/dL (ref 0.0–1.0)
pH: 7.5 (ref 5.0–8.0)

## 2014-11-10 LAB — WET PREP, GENITAL
TRICH WET PREP: NONE SEEN
WBC, Wet Prep HPF POC: NONE SEEN
YEAST WET PREP: NONE SEEN

## 2014-11-10 LAB — HCG, QUANTITATIVE, PREGNANCY: HCG, BETA CHAIN, QUANT, S: 648 m[IU]/mL — AB (ref <5)

## 2014-11-10 LAB — ABO/RH: ABO/RH(D): O POS

## 2014-11-10 LAB — POC URINE PREG, ED: PREG TEST UR: POSITIVE — AB

## 2014-11-10 MED ORDER — SODIUM CHLORIDE 0.9 % IV SOLN
INTRAVENOUS | Status: DC
  Administered 2014-11-10: 16:00:00 via INTRAVENOUS

## 2014-11-10 NOTE — ED Notes (Signed)
Patient transported to Ultrasound 

## 2014-11-10 NOTE — ED Notes (Signed)
Pt presents with c/o abdominal cramping that started three days ago. Pt reports some nausea but no vomiting or diarrhea. Pt reports there is a chance she could be pregnant.

## 2014-11-10 NOTE — ED Provider Notes (Signed)
CSN: [id number]     Arrival date & time 11/10/14  1319 History   None    Chief Complaint  Patient presents with  . Abdominal Cramping     (Consider location/radiation/quality/duration/timing/severity/associated sxs/prior Treatment) Patient is a 24 y.o. female presenting with cramps. The history is provided by the patient. No language interpreter was used.  Abdominal Cramping   Julia Costa is a 24 y.o female who presents for new onset, Costa left sided abdominal cramping and nausea for the past 3 days.  She states she has some white vaginal discharge for the past 2 days.  She took a home pregnancy test which was positive.  She has had 2 children in the past and states that this is different from her other pregnancies. She has not had any treatment prior to coming to the ED.  She denies any fever, chest pain, shortness of breath, vomiting, dysuria, hematuria, urinary frequency, or vaginal bleeding. Her LMP was 10/05/14.  History reviewed. No pertinent past medical history. History reviewed. No pertinent past surgical history. No family history on file. History  Substance Use Topics  . Smoking status: Never Smoker   . Smokeless tobacco: Not on file  . Alcohol Use: No   OB History    Gravida Para Term Preterm AB TAB SAB Ectopic Multiple Living   Review of Systems  Cardiovascular: Negative for palpitations.  Gastrointestinal: Positive for diarrhea.  All other systems reviewed and are negative.     Allergies  Review of patient's allergies indicates no known allergies.  Home Medications   Prior to Admission medications   Medication Sig Start Date End Date Taking? Authorizing Provider  ibuprofen (ADVIL,MOTRIN) 400 MG tablet Take 1 tablet (400 mg total) by mouth every 6 (six) hours as needed. Patient not taking: Reported on 07/07/2014 03/15/14   Ladona Mow, PA-C  LORazepam (ATIVAN) 0.5 MG tablet Take 1 tablet (0.5 mg total) by mouth 2 (two) times daily as needed  for anxiety. 07/07/14   Elpidio Anis, PA-C  metroNIDAZOLE (FLAGYL) 500 MG tablet Take 1 tablet (500 mg total) by mouth 2 (two) times daily. Patient not taking: Reported on 07/07/2014 03/06/14   Azalia Bilis, MD   BP 108/62 mmHg  Pulse 73  Temp(Src) 98.2 F (36.8 C) (Oral)  Resp 15  SpO2 100%  LMP 10/05/2014 Physical Exam  Constitutional: She is oriented to person, place, and time. She appears well-developed and well-nourished.  HENT:  Head: Normocephalic and atraumatic.  Eyes: Conjunctivae are normal.  Neck: Normal range of motion. Neck supple.  Cardiovascular: Normal rate, regular rhythm and normal heart sounds.   Pulmonary/Chest: Effort normal and breath sounds normal.  Abdominal: Soft. Normal appearance. She exhibits no distension and no mass. There is tenderness. There is no rebound and no guarding.  Left sided suprapubic tenderness.   Genitourinary:  Pelvic Exam: Chaperone present  Small amount of white vaginal discharge. No vaginal bleeding. Cervical os is closed. No adnexal tenderness to palpation.  Musculoskeletal: Normal range of motion.  Neurological: She is alert and oriented to person, place, and time.  Skin: Skin is warm and dry.  Nursing note and vitals reviewed.   ED Course  Procedures (including critical care time) Labs Review Labs Reviewed  WET PREP, GENITAL - Abnormal; Notable for the following:    Clue Cells Wet Prep HPF POC FEW (*)    All other components within normal limits  URINALYSIS, ROUTINE  W REFLEX MICROSCOPIC - Abnormal; Notable for the following:    APPearance CLOUDY (*)    Hgb urine dipstick LARGE (*)    Nitrite POSITIVE (*)    All other components within normal limits  HCG, QUANTITATIVE, PREGNANCY - Abnormal; Notable for the following:    hCG, Beta Chain, Quant, S 648 (*)    All other components within normal limits  URINE MICROSCOPIC-ADD ON - Abnormal; Notable for the following:    Squamous Epithelial / LPF MANY (*)    Bacteria, UA MANY  (*)    All other components within normal limits  POC URINE PREG, ED - Abnormal; Notable for the following:    Preg Test, Ur POSITIVE (*)    All other components within normal limits  CBC WITH DIFFERENTIAL/PLATELET  BASIC METABOLIC PANEL  HIV ANTIBODY (ROUTINE TESTING)  ABO/RH  GC/CHLAMYDIA PROBE AMP ([place])    Imaging Review US Ob Comp Less 14 Wks  11/10/2014   CLINICAL DATA:  Patient with lower abdominal cramping. Positive pregnancy test. Beta HCG, 648. Patient 5 weeks and 1 day pregnant based on her last menstrual period.  EXAM: OBSTETRIC <14 WK [place] AND TRANSVAGINAL OB US  TECHNIQUE: Both transabdominal and transvaginal ultrasound examinations were performed for complete evaluation of the gestation as well as the maternal uterus, adnexal regions, and pelvic cul-de-sac. Transvaginal technique was performed to assess early pregnancy.  COMPARISON:  None.  FINDINGS: Intrauterine gestational sac: None  Yolk sac:  None  Embryo:  None  Cardiac Activity: Not applicable  Maternal uterus/adnexae: No uterine masses. Uterus normal in size. Endometrium is thickened measuring 19 mm. No endometrial mass. Cervix is closed/unremarkable.  Complex left ovarian cyst consistent with a corpus luteum, measuring 18 mm. Ovaries otherwise unremarkable.  Bladder wall appears mildly thickened. There is floating debris within the bladder.  No adnexal masses.  Trace pelvic free fluid.  IMPRESSION: 1. There is no intrauterine gestational sac or evidence of an ectopic pregnancy. Endometrium is thickened 19 mm. This could reflect an early intrauterine pregnancy or failed pregnancy. Ectopic pregnancy is also possible. Recommend followup ultrasound in 7-10 days and serial beta HCG levels. 2. Ovaries and adnexa are unremarkable.   Electronically Signed   By: Amie Portland M.D.   On: 11/10/2014 16:54   US Ob Transvaginal  11/10/2014   CLINICAL DATA:  Patient with lower abdominal cramping. Positive pregnancy test. Beta HCG, 648.  Patient 5 weeks and 1 day pregnant based on her last menstrual period.  EXAM: OBSTETRIC <14 WK [place] AND TRANSVAGINAL OB US  TECHNIQUE: Both transabdominal and transvaginal ultrasound examinations were performed for complete evaluation of the gestation as well as the maternal uterus, adnexal regions, and pelvic cul-de-sac. Transvaginal technique was performed to assess early pregnancy.  COMPARISON:  None.  FINDINGS: Intrauterine gestational sac: None  Yolk sac:  None  Embryo:  None  Cardiac Activity: Not applicable  Maternal uterus/adnexae: No uterine masses. Uterus normal in size. Endometrium is thickened measuring 19 mm. No endometrial mass. Cervix is closed/unremarkable.  Complex left ovarian cyst consistent with a corpus luteum, measuring 18 mm. Ovaries otherwise unremarkable.  Bladder wall appears mildly thickened. There is floating debris within the bladder.  No adnexal masses.  Trace pelvic free fluid.  IMPRESSION: 1. There is no intrauterine gestational sac or evidence of an ectopic pregnancy. Endometrium is thickened 19 mm. This could reflect an early intrauterine pregnancy or failed pregnancy. Ectopic pregnancy is also possible. Recommend followup ultrasound in 7-10 days and serial  beta HCG levels. 2. Ovaries and adnexa are unremarkable.   Electronically Signed   By: Amie Portlandavid  Ormond M.D.   On: 11/10/2014 16:54     EKG Interpretation None      MDM   Final diagnoses:  Abdominal pain during pregnancy   G3P2A0 Patient is pregnant by home pregnancy test and presents for left sided abdominal pain.  LMP 10/05/14. Her hCG is 650 with no intrauterine gestational sac or evidence of ectopic pregnancy. She has is afebrile, not tachycardic, and not hypotensive.  Her CBC and BMP are unremarkable. Wet prep is normal and she does not have a UTI.   This is an ectopic pregnancy until proven otherwise and I discussed with the patient that she would need repeat lab work in 48 hours and a repeat ultrasound in 7 days.   I have given her women's outpatient clinic for follow up and strict return precautions for increased abdominal pain, vaginal bleeding or dizziness. She can take tylenol for pain. She agrees with the plan.     Julia GosselinHanna Patel-Mills, PA-C 11/10/14 1751  Arby BarretteMarcy Pfeiffer, MD 11/12/14 1158

## 2014-11-10 NOTE — ED Notes (Signed)
Positive POC Preg Announced over radio x 2 no provider signed up at time.

## 2014-11-10 NOTE — Discharge Instructions (Signed)
Take tylenol for pain.  Follow up with women's outpatient clinic for repeat HcG level to rule out ectopic pregnancy.  There was no intrauterine or ectopic pregnancy seen on Ultrasound today. HcG is 650 today.

## 2014-11-11 LAB — GC/CHLAMYDIA PROBE AMP (~~LOC~~) NOT AT ARMC
Chlamydia: NEGATIVE
NEISSERIA GONORRHEA: NEGATIVE

## 2014-11-11 LAB — HIV ANTIBODY (ROUTINE TESTING W REFLEX): HIV SCREEN 4TH GENERATION: NONREACTIVE

## 2014-11-12 ENCOUNTER — Other Ambulatory Visit: Payer: Medicaid Other

## 2014-11-12 ENCOUNTER — Telehealth: Payer: Self-pay | Admitting: *Deleted

## 2014-11-12 DIAGNOSIS — O3680X0 Pregnancy with inconclusive fetal viability, not applicable or unspecified: Secondary | ICD-10-CM

## 2014-11-12 LAB — HCG, QUANTITATIVE, PREGNANCY: HCG, BETA CHAIN, QUANT, S: 2083 m[IU]/mL

## 2014-11-12 NOTE — Telephone Encounter (Signed)
Reviewed HCG results with [place]. She stated that patient has appropriate rise she should have f/u ultrasound in 1 week. Ultrasound scheduled for 11/19/14 at 1130. Called patient and left voicemail to call us back for results.

## 2014-11-12 NOTE — Progress Notes (Signed)
Reviewed hcg results with [place]. Appropriate rise in hcg, needs ultrasound in 7 days.

## 2014-11-12 NOTE — Telephone Encounter (Signed)
Patient called back and was informed of results and ultrasound appointment given. Patient voiced understanding and had no questions.

## 2014-11-19 ENCOUNTER — Ambulatory Visit (HOSPITAL_COMMUNITY): Payer: Medicaid Other

## 2014-11-20 ENCOUNTER — Ambulatory Visit (HOSPITAL_COMMUNITY)
Admission: RE | Admit: 2014-11-20 | Discharge: 2014-11-20 | Disposition: A | Discharge status: Home / Self Care | Payer: Medicaid Other | Source: Ambulatory Visit | Attending: Advanced Practice Midwife | Admitting: Advanced Practice Midwife

## 2014-11-20 ENCOUNTER — Other Ambulatory Visit: Payer: Self-pay | Admitting: Advanced Practice Midwife

## 2014-11-20 DIAGNOSIS — O208 Other hemorrhage in early pregnancy: Secondary | ICD-10-CM | POA: Diagnosis not present

## 2014-11-20 DIAGNOSIS — Z36 Encounter for antenatal screening of mother: Secondary | ICD-10-CM | POA: Insufficient documentation

## 2014-11-20 DIAGNOSIS — O3680X0 Pregnancy with inconclusive fetal viability, not applicable or unspecified: Secondary | ICD-10-CM

## 2014-11-20 DIAGNOSIS — N831 Corpus luteum cyst: Secondary | ICD-10-CM | POA: Insufficient documentation

## 2014-11-20 DIAGNOSIS — O3481 Maternal care for other abnormalities of pelvic organs, first trimester: Secondary | ICD-10-CM | POA: Insufficient documentation

## 2014-11-20 DIAGNOSIS — Z3A01 Less than 8 weeks gestation of pregnancy: Secondary | ICD-10-CM | POA: Diagnosis not present

## 2014-11-21 ENCOUNTER — Telehealth: Payer: Self-pay

## 2014-11-21 NOTE — Telephone Encounter (Signed)
Called patient and informed her of results and [facility] advised she try health department or any other OB/GYN in the area as we are not accepting new patients at this time. Patient verbalized understanding and gratitude. No questions or concerns.

## 2014-11-21 NOTE — Telephone Encounter (Signed)
-----   Message from [place], [place] sent at 11/20/2014  7:34 PM EDT ----- Notify pt that US shows live IUP. Start [facility]NC.

## 2014-12-22 ENCOUNTER — Other Ambulatory Visit (HOSPITAL_COMMUNITY): Payer: Self-pay | Admitting: Physician Assistant

## 2014-12-22 DIAGNOSIS — Z3682 Encounter for antenatal screening for nuchal translucency: Secondary | ICD-10-CM

## 2014-12-31 ENCOUNTER — Ambulatory Visit (HOSPITAL_COMMUNITY)
Admission: RE | Admit: 2014-12-31 | Discharge: 2014-12-31 | Disposition: A | Discharge status: Home / Self Care | Payer: Medicaid Other | Source: Ambulatory Visit | Attending: Physician Assistant | Admitting: Physician Assistant

## 2014-12-31 ENCOUNTER — Encounter (HOSPITAL_COMMUNITY): Payer: Self-pay

## 2014-12-31 DIAGNOSIS — Z3A12 12 weeks gestation of pregnancy: Secondary | ICD-10-CM | POA: Diagnosis not present

## 2014-12-31 DIAGNOSIS — Z3682 Encounter for antenatal screening for nuchal translucency: Secondary | ICD-10-CM | POA: Insufficient documentation

## 2014-12-31 DIAGNOSIS — Z36 Encounter for antenatal screening of mother: Secondary | ICD-10-CM | POA: Diagnosis present

## 2015-01-08 ENCOUNTER — Emergency Department (HOSPITAL_COMMUNITY)
Admission: EM | Admit: 2015-01-08 | Discharge: 2015-01-09 | Disposition: A | Discharge status: Home / Self Care | Payer: Medicaid Other | Attending: Emergency Medicine | Admitting: Emergency Medicine

## 2015-01-08 ENCOUNTER — Ambulatory Visit (HOSPITAL_COMMUNITY)
Admission: RE | Admit: 2015-01-08 | Discharge: 2015-01-08 | Disposition: A | Discharge status: Home / Self Care | Payer: Medicaid Other | Attending: Psychiatry | Admitting: Psychiatry

## 2015-01-08 DIAGNOSIS — O99321 Drug use complicating pregnancy, first trimester: Secondary | ICD-10-CM | POA: Diagnosis not present

## 2015-01-08 DIAGNOSIS — R61 Generalized hyperhidrosis: Secondary | ICD-10-CM | POA: Diagnosis not present

## 2015-01-08 DIAGNOSIS — O99341 Other mental disorders complicating pregnancy, first trimester: Secondary | ICD-10-CM | POA: Diagnosis present

## 2015-01-08 DIAGNOSIS — F1210 Cannabis abuse, uncomplicated: Secondary | ICD-10-CM | POA: Insufficient documentation

## 2015-01-08 DIAGNOSIS — F419 Anxiety disorder, unspecified: Secondary | ICD-10-CM | POA: Diagnosis present

## 2015-01-08 DIAGNOSIS — R4589 Other symptoms and signs involving emotional state: Secondary | ICD-10-CM

## 2015-01-08 DIAGNOSIS — O9989 Other specified diseases and conditions complicating pregnancy, childbirth and the puerperium: Secondary | ICD-10-CM | POA: Insufficient documentation

## 2015-01-08 DIAGNOSIS — F489 Nonpsychotic mental disorder, unspecified: Secondary | ICD-10-CM | POA: Diagnosis not present

## 2015-01-08 DIAGNOSIS — R4689 Other symptoms and signs involving appearance and behavior: Secondary | ICD-10-CM

## 2015-01-08 DIAGNOSIS — Z79899 Other long term (current) drug therapy: Secondary | ICD-10-CM | POA: Diagnosis not present

## 2015-01-08 DIAGNOSIS — Z3A12 12 weeks gestation of pregnancy: Secondary | ICD-10-CM | POA: Insufficient documentation

## 2015-01-08 LAB — SALICYLATE LEVEL

## 2015-01-08 LAB — CBC
HCT: 37.2 % (ref 36.0–46.0)
HEMOGLOBIN: 12.9 g/dL (ref 12.0–15.0)
MCH: 31.9 pg (ref 26.0–34.0)
MCHC: 34.7 g/dL (ref 30.0–36.0)
MCV: 91.9 fL (ref 78.0–100.0)
Platelets: 250 10*3/uL (ref 150–400)
RBC: 4.05 MIL/uL (ref 3.87–5.11)
RDW: 12.6 % (ref 11.5–15.5)
WBC: 10.8 10*3/uL — ABNORMAL HIGH (ref 4.0–10.5)

## 2015-01-08 LAB — COMPREHENSIVE METABOLIC PANEL
ALBUMIN: 3.7 g/dL (ref 3.5–5.0)
ALK PHOS: 43 U/L (ref 38–126)
ALT: 14 U/L (ref 14–54)
ANION GAP: 8 (ref 5–15)
AST: 19 U/L (ref 15–41)
BUN: 7 mg/dL (ref 6–20)
CO2: 26 mmol/L (ref 22–32)
Calcium: 9 mg/dL (ref 8.9–10.3)
Chloride: 102 mmol/L (ref 101–111)
Creatinine, Ser: 0.47 mg/dL (ref 0.44–1.00)
GFR calc Af Amer: >60 mL/min (ref >60)
GFR calc non Af Amer: >60 mL/min (ref >60)
Glucose, Bld: 79 mg/dL (ref 65–99)
Potassium: 3.4 mmol/L — ABNORMAL LOW (ref 3.5–5.1)
Sodium: 136 mmol/L (ref 135–145)
Total Bilirubin: 0.6 mg/dL (ref 0.3–1.2)
Total Protein: 7.2 g/dL (ref 6.5–8.1)

## 2015-01-08 LAB — ACETAMINOPHEN LEVEL

## 2015-01-08 LAB — RAPID URINE DRUG SCREEN, HOSP PERFORMED
AMPHETAMINES: NOT DETECTED
BENZODIAZEPINES: NOT DETECTED
Barbiturates: NOT DETECTED
Cocaine: NOT DETECTED
Opiates: NOT DETECTED
Tetrahydrocannabinol: POSITIVE — AB

## 2015-01-08 LAB — POC URINE PREG, ED: Preg Test, Ur: POSITIVE — AB

## 2015-01-08 LAB — ETHANOL

## 2015-01-08 MED ORDER — DIPHENHYDRAMINE HCL 25 MG PO CAPS
25.0000 mg | ORAL_CAPSULE | Freq: Once | ORAL | Status: AC
  Administered 2015-01-08: 25 mg via ORAL
  Filled 2015-01-08: qty 1

## 2015-01-08 NOTE — ED Notes (Signed)
Patient and belongings have been wanded.  

## 2015-01-08 NOTE — BH Assessment (Signed)
Patient is a 24 year old female that is [redacted] weeks pregnant.  Patient reports SI.  Patient reports, "I just do not want to be here anymore".  Per Dr. Lucianne Muss patient meets criteria for inpatient hospitalization.

## 2015-01-08 NOTE — ED Notes (Signed)
Pt arrived to unit crying,  Refuses to sign belonging sheet.  Pt says "I want to go home,  This is not the help I need"  She appears very agitated and angry.  She was reassured of her safety.  Fifteen minute checks in place.
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
## 2015-01-08 NOTE — ED Notes (Signed)
Pt AAO x 3, no distress noted, cooperative, sad, tearful. Pt eating dinner at present. Passive SI noted.  Monitoring for safety, Q 15 min checks in effect.

## 2015-01-08 NOTE — ED Notes (Addendum)
Pt sent over from [facility], has been assessed. Pt reports SI thoughts without plan since becoming pregnant. Pt estimated [redacted] weeks pregnant. Pt contracts for safety. Denies HI, AH/VH. Denies pain. Pt cooperative

## 2015-01-08 NOTE — ED Provider Notes (Signed)
CSN: [id number]     Arrival date & time 01/08/15  1436 History   First MD Initiated Contact with Patient 01/08/15 1608     Chief Complaint  Patient presents with  . Suicidal      HPI  Patient presents for evaluation after being seen at behavioral health hospital.  He is pregnant [redacted] weeks. G3 P2. Has gotten prenatal care and has had a normal ultrasound. Has an uncomplicated pregnancy. However she's having increasing depression and suicidal thoughts and anxiety as her pain see progress his. No difficulties with her first 2 pregnancies psychiatrically. However, she states "my mom thinks I had postpartum depression after my second baby 4 years ago." He did have eclampsia with her first 2 pregnancies. She states she had proteinuria urine high blood pressure. Did not have seizures.  Denies any physical complaints no nausea. No vaginal bleeding discharge or abdominal pain.    No past medical history on file. No past surgical history on file. No family history on file. History  Substance Use Topics  . Smoking status: Never Smoker   . Smokeless tobacco: Not on file  . Alcohol Use: No   OB History    Gravida Para Term Preterm AB TAB SAB Ectopic Multiple Living   Review of Systems  Constitutional: Negative for fever, chills, diaphoresis, appetite change and fatigue.  HENT: Negative for mouth sores, sore throat and trouble swallowing.   Eyes: Negative for visual disturbance.  Respiratory: Negative for cough, chest tightness, shortness of breath and wheezing.   Cardiovascular: Negative for chest pain.  Gastrointestinal: Negative for nausea, vomiting, abdominal pain, diarrhea and abdominal distention.  Endocrine: Negative for polydipsia, polyphagia and polyuria.  Genitourinary: Negative for dysuria, frequency and hematuria.  Musculoskeletal: Negative for gait problem.  Skin: Negative for color change, pallor and rash.  Neurological: Negative for dizziness, syncope,  light-headedness and headaches.  Hematological: Does not bruise/bleed easily.  Psychiatric/Behavioral: Positive for suicidal ideas and dysphoric mood. Negative for behavioral problems and confusion. The patient is nervous/anxious.       Allergies  Review of patient's allergies indicates no known allergies.  Home Medications   Prior to Admission medications   Medication Sig Start Date End Date Taking? Authorizing Provider  Prenatal Vit-Fe Fumarate-FA (PRENATAL VITAMIN PO) Take 1 tablet by mouth daily.   Yes Historical Provider, MD  ibuprofen (ADVIL,MOTRIN) 400 MG tablet Take 1 tablet (400 mg total) by mouth every 6 (six) hours as needed. Patient not taking: Reported on 07/07/2014 03/15/14   Ladona Mow, PA-C  LORazepam (ATIVAN) 0.5 MG tablet Take 1 tablet (0.5 mg total) by mouth 2 (two) times daily as needed for anxiety. Patient not taking: Reported on 01/08/2015 07/07/14   Elpidio Anis, PA-C  metroNIDAZOLE (FLAGYL) 500 MG tablet Take 1 tablet (500 mg total) by mouth 2 (two) times daily. Patient not taking: Reported on 07/07/2014 03/06/14   Azalia Bilis, MD   BP 111/53 mmHg  Pulse 90  Temp(Src) 98.1 F (36.7 C) (Oral)  Resp 20  SpO2 100%  LMP 10/05/2014 Physical Exam  Constitutional: She is oriented to person, place, and time. She appears well-developed and well-nourished. No distress.  HENT:  Head: Normocephalic.  Eyes: Conjunctivae are normal. Pupils are equal, round, and reactive to light. No scleral icterus.  Neck: Normal range of motion. Neck supple. No thyromegaly present.  Cardiovascular: Normal rate and regular rhythm.  Exam reveals no gallop and no  friction rub.   No murmur heard. Pulmonary/Chest: Effort normal and breath sounds normal. No respiratory distress. She has no wheezes. She has no rales.  Abdominal: Soft. Bowel sounds are normal. She exhibits no distension. There is no tenderness. There is no rebound.  Musculoskeletal: Normal range of motion.  Neurological: She  is alert and oriented to person, place, and time.  Reflexes. No dependent edema.  Skin: Skin is warm and dry. No rash noted.  Psychiatric: She has a normal mood and affect. Her behavior is normal.    ED Course  Procedures (including critical care time) Labs Review Labs Reviewed  ACETAMINOPHEN LEVEL - Abnormal; Notable for the following:    Acetaminophen (Tylenol), Serum <10 (*)    All other components within normal limits  CBC - Abnormal; Notable for the following:    WBC 10.8 (*)    All other components within normal limits  COMPREHENSIVE METABOLIC PANEL - Abnormal; Notable for the following:    Potassium 3.4 (*)    All other components within normal limits  URINE RAPID DRUG SCREEN, HOSP PERFORMED - Abnormal; Notable for the following:    Tetrahydrocannabinol POSITIVE (*)    All other components within normal limits  POC URINE PREG, ED - Abnormal; Notable for the following:    Preg Test, Ur POSITIVE (*)    All other components within normal limits  ETHANOL  SALICYLATE LEVEL  URINALYSIS, ROUTINE W REFLEX MICROSCOPIC (NOT AT [facility])    Imaging Review No results found.   EKG Interpretation None      MDM   Final diagnoses:  Suicidal behavior    Patient does not have hypertension or hyperreflexia. Urine pending. Normal platelet count and CMP. Awaiting psychiatric evaluation.  She remains voluntary.    Rolland Porter, MD 01/08/15 2201

## 2015-01-09 DIAGNOSIS — F419 Anxiety disorder, unspecified: Secondary | ICD-10-CM | POA: Diagnosis not present

## 2015-01-09 DIAGNOSIS — F489 Nonpsychotic mental disorder, unspecified: Secondary | ICD-10-CM

## 2015-01-09 DIAGNOSIS — R4589 Other symptoms and signs involving emotional state: Secondary | ICD-10-CM | POA: Insufficient documentation

## 2015-01-09 DIAGNOSIS — R4689 Other symptoms and signs involving appearance and behavior: Secondary | ICD-10-CM

## 2015-01-09 LAB — URINALYSIS, ROUTINE W REFLEX MICROSCOPIC
Bilirubin Urine: NEGATIVE
Glucose, UA: NEGATIVE mg/dL
Ketones, ur: NEGATIVE mg/dL
LEUKOCYTES UA: NEGATIVE
NITRITE: NEGATIVE
Protein, ur: NEGATIVE mg/dL
SPECIFIC GRAVITY, URINE: 1.023 (ref 1.005–1.030)
UROBILINOGEN UA: 1 mg/dL (ref 0.0–1.0)
pH: 7 (ref 5.0–8.0)

## 2015-01-09 LAB — URINE MICROSCOPIC-ADD ON

## 2015-01-09 NOTE — ED Notes (Signed)
Pt is alert and oriented. Pt reports that she has been having financial stress paying her rent and stress being pregnant. Pt says that she went to a hospital in another state and was given something for anxiety and then released. She says that is what she had expected when she came to [facility]. Pt denies si and hi. She denies hallucinations. Pt says that she has two children that are staying with their biological father and they share custody. She currently lives with her boyfriend.

## 2015-01-09 NOTE — Discharge Instructions (Signed)
For your ongoing behavioral health needs you are advised to follow up with one of the following agencies.  Both provide psychiatry and counseling:        [facility]      [address]      [place], [place] [address]      [phone]      New patients are seen at their walk-in clinic.  Walk-in hours are Monday - Friday from 8:00 am - 12:00 pm, and from 1:00 pm - 3:00 pm.  Walk-in patients are seen on a first come, first served basis, so try to arrive as early as possible for the best chance of being seen the same day.  There is an initial fee of $22.50.       [facility]      [address] [place], [place] [address]      [phone]

## 2015-01-09 NOTE — BH Assessment (Signed)
BHH Assessment Progress Note  Per Thedore Mins, MD, this pt does not require psychiatric hospitalization at this time.  She is to be discharged from [facility] with outpatient referrals.  Discharge instructions include referral information for [facility] [place] and for [facility].  Pt's nurse has been notified.  Doylene Canning, MA Triage Specialist [phone]

## 2015-01-09 NOTE — BHH Suicide Risk Assessment (Cosign Needed)
Suicide Risk Assessment  Discharge Assessment   [facility] Discharge Suicide Risk Assessment   Demographic Factors:  Low socioeconomic status  Total Time spent with patient: 20 minutes  Musculoskeletal: Strength & Muscle Tone: within normal limits Gait & Station: normal Patient leans: N/A  Psychiatric Specialty Exam:     Blood pressure 105/58, pulse 67, temperature 97.7 F (36.5 C), temperature source Oral, resp. rate 16, last menstrual period 10/05/2014, SpO2 100 %.There is no weight on file to calculate BMI.  General Appearance: Casual and Fairly Groomed  Eye Contact::  Good  Speech:  Clear and Coherent and Normal Rate409  Volume:  Normal  Mood:  Anxious  Affect:  Congruent  Thought Process:  Coherent, Goal Directed and Intact  Orientation:  Full (Time, Place, and Person)  Thought Content:  WDL  Suicidal Thoughts:  No  Homicidal Thoughts:  No  Memory:  Immediate;   Good Recent;   Good Remote;   Good  Judgement:  Fair  Insight:  Fair  Psychomotor Activity:  Normal  Concentration:  Good  Recall:  NA  Fund of Knowledge:Fair  Language: Good  Akathisia:  NA  Handed:  Right  AIMS (if indicated):     Assets:  Desire for Improvement  Sleep:     Cognition: WNL  ADL's:  Intact      Has this patient used any form of tobacco in the last 30 days? (Cigarettes, Smokeless Tobacco, Cigars, and/or Pipes) N/A  Mental Status Per Nursing Assessment::   On Admission:     Current Mental Status by Physician: NA  Loss Factors: NA  Historical Factors: NA  Risk Reduction Factors:   Responsible for children under 74 years of age, Sense of responsibility to family, Religious beliefs about death, Employed and Positive therapeutic relationship  Continued Clinical Symptoms:  Severe Anxiety and/or Agitation  Cognitive Features That Contribute To Risk:  Closed-mindedness and Polarized thinking    Suicide Risk:  Minimal: No identifiable suicidal ideation.  Patients presenting with  no risk factors but with morbid ruminations; may be classified as minimal risk based on the severity of the depressive symptoms  Principal Problem: <principal problem not specified> Discharge Diagnoses:  Patient Active Problem List   Diagnosis Date Noted  . Encounter for (NT) nuchal translucency scan [Z36]   . [redacted] weeks gestation of pregnancy [Z3A.12]     Follow-up Information    Schedule an appointment as soon as possible for a visit with [facility]   Contact information:   [address] [place] [address] [phone]       Plan Of Care/Follow-up recommendations:  Activity:  As tolerated  Diet:  Regular  Is patient on multiple antipsychotic therapies at discharge:  No   Has Patient had three or more failed trials of antipsychotic monotherapy by history:  No  Recommended Plan for Multiple Antipsychotic Therapies: NA    Encarnacion Bole C   PMHNP-BC 01/09/2015, 11:30 AM

## 2015-01-09 NOTE — ED Notes (Signed)
PT d/c to home. All items returned. D/C instructions given. Pt denies si and hi.

## 2015-01-09 NOTE — Consult Note (Signed)
[place] Psychiatry Consult   Reason for Consult:  Anxiety disorder Referring Physician:  EDP Patient Identification: Julia Costa MRN:  [id number] Principal Diagnosis: Anxiety disorder Diagnosis:   Patient Active Problem List   Diagnosis Date Noted  . Anxiety disorder [F41.9] 01/09/2015    Priority: High  . Encounter for (NT) nuchal translucency scan [Z36]   . [redacted] weeks gestation of pregnancy [Z3A.12]     Total Time spent with patient: 1 hour  Subjective:   Julia Costa is a 24 y.o. female patient admitted with Anxiety diasorder.  HPI:  AA female, 24 years old was evaluated this morning for suicidal ideation due to unexpected pregnancy.  Patient reported that she is pregnant and has two children from a different man.  Patient stated that she became emotional and angry yesterday and wanted to speak with some body about her feelings.  Patient admitted to saying she could not take it any more but she did not mean killing herself.  Patient denied suicide attempt and denied SI/HI/AVH today.  Patient reported her deterrent from suicide are her two children and the unborn baby and she is in a safe and supportive relationship.  Patient is employed and lives with her boyfriend.  Patient is discharged home and will follow up with [facility] [place].  HPI Elements:   Location:  Anxiety disorder. Quality:  Moderate- severe. Severity:  moderate -severe. Timing:  acute. Duration:  Sudden. Context:  seeking treatment for anxiety over unplanned pregnancy..  Past Medical History: No past medical history on file. No past surgical history on file. Family History: No family history on file. Social History:  History  Alcohol Use No     History  Drug Use No    History   Social History  . Marital Status: Single    Spouse Name: N/A  . Number of Children: N/A  . Years of Education: N/A   Social History Main Topics  . Smoking status: Never Smoker   . Smokeless tobacco: Not  on file  . Alcohol Use: No  . Drug Use: No  . Sexual Activity: Yes    Birth Control/ Protection: None   Other Topics Concern  . Not on file   Social History Narrative   Additional Social History:    History of alcohol / drug use?: No history of alcohol / drug abuse                     Allergies:  No Known Allergies  Labs:  Results for orders placed or performed during the hospital encounter of 01/08/15 (from the past 48 hour(s))  Acetaminophen level     Status: Abnormal   Collection Time: 01/08/15  4:26 PM  Result Value Ref Range   Acetaminophen (Tylenol), Serum <10 (L) 10 - 30 ug/mL    Comment:        THERAPEUTIC CONCENTRATIONS VARY SIGNIFICANTLY. A RANGE OF 10-30 ug/mL MAY BE AN EFFECTIVE CONCENTRATION FOR MANY PATIENTS. HOWEVER, SOME ARE BEST TREATED AT CONCENTRATIONS OUTSIDE THIS RANGE. ACETAMINOPHEN CONCENTRATIONS >150 ug/mL AT 4 HOURS AFTER INGESTION AND >50 ug/mL AT 12 HOURS AFTER INGESTION ARE OFTEN ASSOCIATED WITH TOXIC REACTIONS.   CBC     Status: Abnormal   Collection Time: 01/08/15  4:26 PM  Result Value Ref Range   WBC 10.8 (H) 4.0 - 10.5 K/uL   RBC 4.05 3.87 - 5.11 MIL/uL   Hemoglobin 12.9 12.0 - 15.0 g/dL   HCT 37.2 36.0 - 46.0 %  MCV 91.9 78.0 - 100.0 fL   MCH 31.9 26.0 - 34.0 pg   MCHC 34.7 30.0 - 36.0 g/dL   RDW 12.6 11.5 - 15.5 %   Platelets 250 150 - 400 K/uL  Comprehensive metabolic panel     Status: Abnormal   Collection Time: 01/08/15  4:26 PM  Result Value Ref Range   Sodium 136 135 - 145 mmol/L   Potassium 3.4 (L) 3.5 - 5.1 mmol/L   Chloride 102 101 - 111 mmol/L   CO2 26 22 - 32 mmol/L   Glucose, Bld 79 65 - 99 mg/dL   BUN 7 6 - 20 mg/dL   Creatinine, Ser 0.47 0.44 - 1.00 mg/dL   Calcium 9.0 8.9 - 10.3 mg/dL   Total Protein 7.2 6.5 - 8.1 g/dL   Albumin 3.7 3.5 - 5.0 g/dL   AST 19 15 - 41 U/L   ALT 14 14 - 54 U/L   Alkaline Phosphatase 43 38 - 126 U/L   Total Bilirubin 0.6 0.3 - 1.2 mg/dL   GFR calc non Af Amer >60  >60 mL/min   GFR calc Af Amer >60 >60 mL/min    Comment: (NOTE) The eGFR has been calculated using the CKD EPI equation. This calculation has not been validated in all clinical situations. eGFR's persistently <60 mL/min signify possible Chronic Kidney Disease.    Anion gap 8 5 - 15  Ethanol (ETOH)     Status: None   Collection Time: 01/08/15  4:26 PM  Result Value Ref Range   Alcohol, Ethyl (B) <5 <5 mg/dL    Comment:        LOWEST DETECTABLE LIMIT FOR SERUM ALCOHOL IS 5 mg/dL FOR MEDICAL PURPOSES ONLY   Salicylate level     Status: None   Collection Time: 01/08/15  4:26 PM  Result Value Ref Range   Salicylate Lvl <1.1 2.8 - 30.0 mg/dL  Urine rapid drug screen (hosp performed)not at [facility]     Status: Abnormal   Collection Time: 01/08/15  4:52 PM  Result Value Ref Range   Opiates NONE DETECTED NONE DETECTED   Cocaine NONE DETECTED NONE DETECTED   Benzodiazepines NONE DETECTED NONE DETECTED   Amphetamines NONE DETECTED NONE DETECTED   Tetrahydrocannabinol POSITIVE (A) NONE DETECTED   Barbiturates NONE DETECTED NONE DETECTED    Comment:        DRUG SCREEN FOR MEDICAL PURPOSES ONLY.  IF CONFIRMATION IS NEEDED FOR ANY PURPOSE, NOTIFY LAB WITHIN 5 DAYS.        LOWEST DETECTABLE LIMITS FOR URINE DRUG SCREEN Drug Class       Cutoff (ng/mL) Amphetamine      1000 Barbiturate      200 Benzodiazepine   941 Tricyclics       740 Opiates          300 Cocaine          300 THC              50   POC Urine Pregnancy, (if pre-menopausal female)  not at [facility]     Status: Abnormal   Collection Time: 01/08/15  4:58 PM  Result Value Ref Range   Preg Test, Ur POSITIVE (A) NEGATIVE    Comment:        THE SENSITIVITY OF THIS METHODOLOGY IS >24 mIU/mL   Urinalysis, Routine w reflex microscopic (not at [facility])     Status: Abnormal   Collection Time: 01/08/15  5:04 PM  Result Value  Ref Range   Color, Urine AMBER (A) YELLOW    Comment: BIOCHEMICALS MAY BE AFFECTED BY COLOR   APPearance  CLOUDY (A) CLEAR   Specific Gravity, Urine 1.023 1.005 - 1.030   pH 7.0 5.0 - 8.0   Glucose, UA NEGATIVE NEGATIVE mg/dL   Hgb urine dipstick LARGE (A) NEGATIVE   Bilirubin Urine NEGATIVE NEGATIVE   Ketones, ur NEGATIVE NEGATIVE mg/dL   Protein, ur NEGATIVE NEGATIVE mg/dL   Urobilinogen, UA 1.0 0.0 - 1.0 mg/dL   Nitrite NEGATIVE NEGATIVE   Leukocytes, UA NEGATIVE NEGATIVE  Urine microscopic-add on     Status: None   Collection Time: 01/08/15  5:04 PM  Result Value Ref Range   Squamous Epithelial / LPF RARE RARE   WBC, UA 0-2 <3 WBC/hpf   RBC / HPF 21-50 <3 RBC/hpf   Bacteria, UA RARE RARE   Urine-Other RARE YEAST     Vitals: Blood pressure 105/58, pulse 67, temperature 97.7 F (36.5 C), temperature source Oral, resp. rate 16, last menstrual period 10/05/2014, SpO2 100 %.  Risk to Self: Suicidal Ideation: Yes-Currently Present Suicidal Intent: Yes-Currently Present Is patient at risk for suicide?: Yes Suicidal Plan?: No Access to Means: No What has been your use of drugs/alcohol within the last 12 months?: NA How many times?: 0 Other Self Harm Risks: NA Triggers for Past Attempts:  (NA) Intentional Self Injurious Behavior: None Risk to Others: Homicidal Ideation: No Thoughts of Harm to Others: No Current Homicidal Intent: No Current Homicidal Plan: No Access to Homicidal Means: No Identified Victim: NA History of harm to others?: No Assessment of Violence: None Noted Violent Behavior Description: NA Does patient have access to weapons?: No Criminal Charges Pending?: No Does patient have a court date: No Prior Inpatient Therapy: Prior Inpatient Therapy: No Prior Therapy Dates: NA Prior Therapy Facilty/Provider(s): NA Reason for Treatment: NA Prior Outpatient Therapy: Prior Outpatient Therapy: No Prior Therapy Dates: NA Prior Therapy Facilty/Provider(s): NA Reason for Treatment: NA Does patient have an ACCT team?: No Does patient have Intensive In-House Services?  :  No Does patient have [facility] services? : No Does patient have P4[facility] services?: No  No current facility-administered medications for this encounter.   Current Outpatient Prescriptions  Medication Sig Dispense Refill  . Prenatal Vit-Fe Fumarate-FA (PRENATAL VITAMIN PO) Take 1 tablet by mouth daily.    [name] ibuprofen (ADVIL,MOTRIN) 400 MG tablet Take 1 tablet (400 mg total) by mouth every 6 (six) hours as needed. (Patient not taking: Reported on 07/07/2014) 30 tablet 0  . LORazepam (ATIVAN) 0.5 MG tablet Take 1 tablet (0.5 mg total) by mouth 2 (two) times daily as needed for anxiety. (Patient not taking: Reported on 01/08/2015) 5 tablet 0  . metroNIDAZOLE (FLAGYL) 500 MG tablet Take 1 tablet (500 mg total) by mouth 2 (two) times daily. (Patient not taking: Reported on 07/07/2014) 14 tablet 0    Musculoskeletal: Strength & Muscle Tone: within normal limits Gait & Station: normal Patient leans: N/A  Psychiatric Specialty Exam: Physical Exam  Review of Systems  Constitutional: Negative.   HENT: Negative.   Eyes: Negative.   Respiratory: Negative.   Cardiovascular: Negative.   Gastrointestinal: Negative.   Genitourinary: Negative.   Musculoskeletal: Negative.   Skin: Negative.   Neurological: Negative.   Endo/Heme/Allergies: Negative.     Blood pressure 105/58, pulse 67, temperature 97.7 F (36.5 C), temperature source Oral, resp. rate 16, last menstrual period 10/05/2014, SpO2 100 %.There is no weight on file to calculate BMI.  General Appearance: Casual and Fairly Groomed  Eye Contact::  Good  Speech:  Clear and Coherent and Normal Rate409  Volume:  Normal  Mood:  Anxious  Affect:  Congruent  Thought Process:  Coherent, Goal Directed and Intact  Orientation:  Full (Time, Place, and Person)  Thought Content:  WDL  Suicidal Thoughts:  No  Homicidal Thoughts:  No  Memory:  Immediate;   Good Recent;   Good Remote;   Good  Judgement:  Fair  Insight:  Fair  Psychomotor Activity:   Normal  Concentration:  Good  Recall:  NA  Fund of Knowledge:Fair  Language: Good  Akathisia:  NA  Handed:  Right  AIMS (if indicated):     Assets:  Desire for Improvement  Sleep:     Cognition: WNL  ADL's:  Intact    Cognition: WNL  Sleep:      Medical Decision Making: Established Problem, Stable/Improving (1)  Disposition:  Discharge home, follow up with [facility] [place], and your Rodney Langton   [facility] 01/09/2015 11:37 AM  Patient seen face-to-face for psychiatric evaluation, chart reviewed and case discussed with the physician extender and developed treatment plan. Reviewed the information documented and agree with the treatment plan. Corena Pilgrim, MD

## 2015-01-14 ENCOUNTER — Other Ambulatory Visit (HOSPITAL_COMMUNITY): Payer: Self-pay | Admitting: Nurse Practitioner

## 2015-01-14 DIAGNOSIS — Z0489 Encounter for examination and observation for other specified reasons: Secondary | ICD-10-CM

## 2015-01-14 DIAGNOSIS — IMO0002 Reserved for concepts with insufficient information to code with codable children: Secondary | ICD-10-CM

## 2015-01-27 ENCOUNTER — Other Ambulatory Visit (HOSPITAL_COMMUNITY): Payer: Self-pay | Admitting: Physician Assistant

## 2015-02-11 ENCOUNTER — Ambulatory Visit (HOSPITAL_COMMUNITY)
Admission: RE | Admit: 2015-02-11 | Discharge: 2015-02-11 | Disposition: A | Discharge status: Home / Self Care | Payer: Medicaid Other | Source: Ambulatory Visit | Attending: Nurse Practitioner | Admitting: Nurse Practitioner

## 2015-02-11 ENCOUNTER — Other Ambulatory Visit (HOSPITAL_COMMUNITY): Payer: Medicaid Other

## 2015-02-11 DIAGNOSIS — Z0489 Encounter for examination and observation for other specified reasons: Secondary | ICD-10-CM

## 2015-02-11 DIAGNOSIS — Z3A18 18 weeks gestation of pregnancy: Secondary | ICD-10-CM | POA: Insufficient documentation

## 2015-02-11 DIAGNOSIS — Z36 Encounter for antenatal screening of mother: Secondary | ICD-10-CM | POA: Diagnosis present

## 2015-02-11 DIAGNOSIS — Z3689 Encounter for other specified antenatal screening: Secondary | ICD-10-CM | POA: Insufficient documentation

## 2015-02-11 DIAGNOSIS — IMO0002 Reserved for concepts with insufficient information to code with codable children: Secondary | ICD-10-CM

## 2015-04-05 ENCOUNTER — Encounter (HOSPITAL_COMMUNITY): Payer: Self-pay

## 2015-04-05 ENCOUNTER — Inpatient Hospital Stay (HOSPITAL_COMMUNITY)
Admission: AD | Admit: 2015-04-05 | Discharge: 2015-04-05 | Disposition: A | Discharge status: Home / Self Care | Payer: Medicaid Other | Source: Ambulatory Visit | Attending: Family Medicine | Admitting: Family Medicine

## 2015-04-05 DIAGNOSIS — Z3A26 26 weeks gestation of pregnancy: Secondary | ICD-10-CM | POA: Insufficient documentation

## 2015-04-05 DIAGNOSIS — Z3A36 36 weeks gestation of pregnancy: Secondary | ICD-10-CM

## 2015-04-05 DIAGNOSIS — Z043 Encounter for examination and observation following other accident: Secondary | ICD-10-CM | POA: Insufficient documentation

## 2015-04-05 DIAGNOSIS — W19XXXA Unspecified fall, initial encounter: Secondary | ICD-10-CM

## 2015-04-05 DIAGNOSIS — O9A212 Injury, poisoning and certain other consequences of external causes complicating pregnancy, second trimester: Secondary | ICD-10-CM | POA: Diagnosis not present

## 2015-04-05 DIAGNOSIS — S3993XA Unspecified injury of pelvis, initial encounter: Secondary | ICD-10-CM

## 2015-04-05 NOTE — MAU Provider Note (Signed)
  History   [id number] @ 26 wks states was walking and tripped over something and fell. States she did not hit her abd but it jarred her and she has pelvic pressure. GFM, denies ROM or vag bleeding.  CSN: [id number]  Arrival date and time: 04/05/15 1906   None     Chief Complaint  Patient presents with  . Fall   HPI  OB History    Gravida Para Term Preterm AB TAB SAB Ectopic Multiple Living   No past medical history on file.  No past surgical history on file.  No family history on file.  Social History  Substance Use Topics  . Smoking status: Never Smoker   . Smokeless tobacco: Not on file  . Alcohol Use: No    Allergies: No Known Allergies  Prescriptions prior to admission  Medication Sig Dispense Refill Last Dose  . Prenatal Vit-Fe Fumarate-FA (PRENATAL VITAMIN PO) Take 1 tablet by mouth daily.   04/04/2015 at Unknown time    Review of Systems  Constitutional: Negative.   Eyes: Negative.   Respiratory: Negative.   Cardiovascular: Negative.   Genitourinary: Negative.   Musculoskeletal: Negative.   Skin: Negative.   Neurological: Negative.   Endo/Heme/Allergies: Negative.   Psychiatric/Behavioral: Negative.    Physical Exam   Blood pressure 121/67, pulse 86, temperature 98.1 F (36.7 C), temperature source Oral, height  (1.651 m), weight 141 lb (63.957 kg), last menstrual period 10/05/2014.  Physical Exam  Constitutional: She is oriented to person, place, and time. She appears well-developed and well-nourished.  HENT:  Head: Normocephalic.  Eyes: Pupils are equal, round, and reactive to light.  Neck: Normal range of motion.  Cardiovascular: Normal rate, regular rhythm, normal heart sounds and intact distal pulses.   Respiratory: Effort normal and breath sounds normal.  GI: Soft. Bowel sounds are normal.  Genitourinary: Vagina normal and uterus normal.  Musculoskeletal: Normal range of motion.  Neurological: She is alert and  oriented to person, place, and time. She has normal reflexes.  Skin: Skin is warm and dry.  Psychiatric: She has a normal mood and affect. Her behavior is normal. Judgment and thought content normal.    MAU Course  Procedures  MDM Fall at 26 wks Stable maternal fetal unit  Assessment and Plan  SVE firm/cl/post/th/high. Will d/c home after monitiring for 1 hr since pt did not hit her abd.  Julia Costa 04/05/2015, 7:29 PM

## 2015-04-05 NOTE — Discharge Instructions (Signed)
Fall Prevention and Home Safety Falls cause injuries and can affect all age groups. It is possible to use preventive measures to significantly decrease the likelihood of falls. There are many simple measures which can make your home safer and prevent falls. OUTDOORS  Repair cracks and edges of walkways and driveways.  Remove high doorway thresholds.  Trim shrubbery on the main path into your home.  Have good outside lighting.  Clear walkways of tools, rocks, debris, and clutter.  Check that handrails are not broken and are securely fastened. Both sides of steps should have handrails.  Have leaves, snow, and ice cleared regularly.  Use sand or salt on walkways during winter months.  In the garage, clean up grease or oil spills. BATHROOM  Install night lights.  Install grab bars by the toilet and in the tub and shower.  Use non-skid mats or decals in the tub or shower.  Place a plastic non-slip stool in the shower to sit on, if needed.  Keep floors dry and clean up all water on the floor immediately.  Remove soap buildup in the tub or shower on a regular basis.  Secure bath mats with non-slip, double-sided rug tape.  Remove throw rugs and tripping hazards from the floors. BEDROOMS  Install night lights.  Make sure a bedside light is easy to reach.  Do not use oversized bedding.  Keep a telephone by your bedside.  Have a firm chair with side arms to use for getting dressed.  Remove throw rugs and tripping hazards from the floor. KITCHEN  Keep handles on pots and pans turned toward the center of the stove. Use back burners when possible.  Clean up spills quickly and allow time for drying.  Avoid walking on wet floors.  Avoid hot utensils and knives.  Position shelves so they are not too high or low.  Place commonly used objects within easy reach.  If necessary, use a sturdy step stool with a grab bar when reaching.  Keep electrical cables out of the  way.  Do not use floor polish or wax that makes floors slippery. If you must use wax, use non-skid floor wax.  Remove throw rugs and tripping hazards from the floor. STAIRWAYS  Never leave objects on stairs.  Place handrails on both sides of stairways and use them. Fix any loose handrails. Make sure handrails on both sides of the stairways are as long as the stairs.  Check carpeting to make sure it is firmly attached along stairs. Make repairs to worn or loose carpet promptly.  Avoid placing throw rugs at the top or bottom of stairways, or properly secure the rug with carpet tape to prevent slippage. Get rid of throw rugs, if possible.  Have an electrician put in a light switch at the top and bottom of the stairs. OTHER FALL PREVENTION TIPS  Wear low-heel or rubber-soled shoes that are supportive and fit well. Wear closed toe shoes.  When using a stepladder, make sure it is fully opened and both spreaders are firmly locked. Do not climb a closed stepladder.  Add color or contrast paint or tape to grab bars and handrails in your home. Place contrasting color strips on first and last steps.  Learn and use mobility aids as needed. Install an electrical emergency response system.  Turn on lights to avoid dark areas. Replace light bulbs that burn out immediately. Get light switches that glow.  Arrange furniture to create clear pathways. Keep furniture in the same place.  Firmly attach carpet with non-skid or double-sided tape.  Eliminate uneven floor surfaces.  Select a carpet pattern that does not visually hide the edge of steps.  Be aware of all pets. OTHER HOME SAFETY TIPS  Set the water temperature for 120 F (48.8 C).  Keep emergency numbers on or near the telephone.  Keep smoke detectors on every level of the home and near sleeping areas. Document Released: 06/24/2002 Document Revised: 01/03/2012 Document Reviewed: 09/23/2011 [facility] Patient Information 2015  [place], [place]. This information is not intended to replace advice given to you by your health care provider. Make sure you discuss any questions you have with your health care provider. Segundo trimestre de Psychiatrist (Second Trimester of Pregnancy) El segundo trimestre va desde la semana13 hasta la 28, desde el cuarto hasta el sexto mes, y suele ser el momento en el que mejor se siente. Su organismo se ha adaptado a Charity fundraiser y comienza a Diplomatic Services operational officer. En general, las nuseas matutinas han disminuido o han desaparecido completamente, p El segundo trimestre es tambin la poca en la que el feto se desarrolla rpidamente. Hacia el final del sexto mes, el feto mide aproximadamente 9pulgadas (23cm) y pesa alrededor de 1 libras (700g). Es probable que sienta que el beb se Teacher, English as a foreign language (da pataditas) entre las 18 y 20semanas del Psychiatrist. CAMBIOS EN EL ORGANISMO Su organismo atraviesa por muchos cambios durante el Foster, y estos varan de Neomia Dear mujer a Educational psychologist.   Seguir [employer]. Notar que la parte baja del abdomen sobresale.  Podrn aparecer las primeras [employer] caderas, el abdomen y las [place].  Es posible que tenga dolores de cabeza que pueden aliviarse con los medicamentos que su mdico autorice.  Tal vez tenga necesidad de orinar con ms frecuencia porque el feto est ejerciendo presin Ambulance person.  Debido al Vanetta Mulders podr sentir Anthoney Harada estomacal con frecuencia.  Puede estar estreida, ya que ciertas hormonas enlentecen los movimientos de los msculos que [employer] desechos a travs de los intestinos.  Pueden aparecer hemorroides o abultarse e hincharse las venas (venas varicosas).  Puede tener dolor de espalda que se debe al [employer] de peso y a que las hormonas del Management consultant las articulaciones entre los huesos de la pelvis, y Public librarian consecuencia de la modificacin del peso y los msculos que mantienen el equilibrio.  Las [employer] seguirn creciendo  y Development worker, community.  Las Veterinary surgeon y estar sensibles al cepillado y al hilo dental.  Pueden aparecer zonas oscuras o manchas (cloasma, mscara del Psychiatrist) en el rostro que probablemente se atenuarn despus del nacimiento del beb.  Es posible que se forme una lnea oscura desde el ombligo hasta la zona del pubis (linea nigra) que probablemente se atenuarn despus del nacimiento del beb.  Tal vez haya cambios en el cabello que pueden incluir su engrosamiento, crecimiento rpido y cambios en la textura. Adems, a algunas mujeres se les cae el cabello durante o despus del embarazo, o tienen el cabello seco o fino. Lo ms probable es que el cabello se le normalice despus del nacimiento del beb. QU DEBE ESPERAR EN LAS CONSULTAS PRENATALES Durante una visita prenatal de rutina:  La pesarn para asegurarse de que usted y el feto estn creciendo normalmente.  Le tomarn la presin arterial.  Le medirn el abdomen para controlar el desarrollo del beb.  Se escucharn los latidos cardacos fetales.  Se evaluarn los resultados de los estudios solicitados en visitas anteriores. El mdico puede  preguntarle lo siguiente:  Cmo se siente.  Si siente los movimientos del beb.  Si ha tenido sntomas anormales, como prdida de lquido, [place], dolores de cabeza intensos o clicos abdominales.  Si tiene [employer]. Otros estudios que podrn realizarse durante el segundo trimestre incluyen lo siguiente:  Anlisis de sangre para detectar:  Concentraciones de hierro bajas (anemia).  Diabetes gestacional (entre la semana 24 y la 28).  Anticuerpos Rh.  Anlisis de orina para detectar infecciones, diabetes o protenas en la orina.  Una ecografa para confirmar que el beb crece y se desarrolla correctamente.  Una amniocentesis para diagnosticar posibles problemas genticos.  Estudios del feto para descartar espina bfida y sndrome de [place]. INSTRUCCIONES PARA EL CUIDADO  EN EL HOGAR   Evite fumar, consumir hierbas, beber alcohol y tomar frmacos que no le hayan recetado. Estas sustancias qumicas afectan la formacin y el desarrollo del beb.  Siga las indicaciones del mdico en relacin con el uso de medicamentos. Durante el embarazo, hay medicamentos que son seguros de tomar y otros que no.  Haga actividad fsica solo en la forma indicada por el mdico. Sentir clicos uterinos es un buen signo para Restaurant manager, fast food actividad fsica.  Contine comiendo alimentos que sanos con regularidad.  Use un sostn que le brinde buen soporte si le [employer].  No se d baos de inmersin en agua caliente, baos turcos ni saunas.  Colquese el cinturn de seguridad cuando conduzca.  No coma carne cruda ni queso sin cocinar; evite el contacto con las bandejas sanitarias de los gatos y la tierra que estos animales usan. Estos elementos contienen grmenes que pueden causar defectos congnitos en el beb.  Tome las vitaminas prenatales.  Si est estreida, pruebe un laxante suave (si el mdico lo autoriza). Consuma ms alimentos ricos en fibra, como vegetales y frutas frescos y Radiation protection practitioner. Beba gran cantidad de lquido para mantener la orina de tono claro o color amarillo plido.  Dese baos de asiento con agua tibia para Engineer, materials o las molestias causadas por las hemorroides. Use una crema para las hemorroides si el mdico la autoriza.  Si tiene venas varicosas, use medias de descanso. Eleve los pies durante , 3 o 4veces por da. Limite la cantidad de sal en su dieta.  No levante objetos pesados, use zapatos de tacones bajos y [address].  Descanse con las piernas elevadas si tiene calambres o dolor de cintura.  Visite a su dentista si an no lo ha Occupational hygienist. Use un cepillo de dientes blando para higienizarse los dientes y psese el hilo dental con suavidad.  Puede seguir [employer], a  menos que el mdico le indique lo contrario.  Concurra a todas las visitas prenatales segn las indicaciones de su mdico. SOLICITE ATENCIN MDICA SI:   [place].  Siente clicos leves, presin en la pelvis o dolor persistente en el abdomen.  Tiene nuseas, vmitos o diarrea persistentes.  Tiene secrecin vaginal con mal olor.  Siente dolor al [employer]. SOLICITE ATENCIN MDICA DE INMEDIATO SI:   Tiene fiebre.  Tiene una prdida de lquido por la vagina.  Tiene sangrado o pequeas prdidas vaginales.  Siente dolor intenso o clicos en el abdomen.  Sube o baja de peso rpidamente.  Tiene dificultad para respirar y siente dolor de pecho.  Sbitamente se le hinchan mucho el rostro, las [place], los tobillos, los pies o las piernas.  No ha sentido los movimientos del beb durante  Georgianne Fick.  Siente un dolor de cabeza intenso que no se alivia con medicamentos.  Hay cambios en la visin. Document Released: 04/13/2005 Document Revised: 07/09/2013 [facility] Patient Information 2015 [place], [place]. This information is not intended to replace advice given to you by your health care provider. Make sure you discuss any questions you have with your health care provider.

## 2015-04-05 NOTE — MAU Note (Signed)
Pt. States she fell about an hour ago on her right side. Denies vaginal bleeding and leaking. Pain in lower right abdomen.

## 2015-04-30 LAB — OB RESULTS CONSOLE GC/CHLAMYDIA
Chlamydia: NEGATIVE
Gonorrhea: NEGATIVE

## 2015-04-30 LAB — OB RESULTS CONSOLE RPR: RPR: NONREACTIVE

## 2015-04-30 LAB — OB RESULTS CONSOLE RUBELLA ANTIBODY, IGM: RUBELLA: IMMUNE

## 2015-04-30 LAB — OB RESULTS CONSOLE ANTIBODY SCREEN: Antibody Screen: NEGATIVE

## 2015-04-30 LAB — OB RESULTS CONSOLE HEPATITIS B SURFACE ANTIGEN: Hepatitis B Surface Ag: NEGATIVE

## 2015-06-18 LAB — OB RESULTS CONSOLE GBS: STREP GROUP B AG: NEGATIVE

## 2015-07-12 ENCOUNTER — Encounter (HOSPITAL_COMMUNITY): Payer: Self-pay

## 2015-07-12 ENCOUNTER — Inpatient Hospital Stay (HOSPITAL_COMMUNITY)
Admission: AD | Admit: 2015-07-12 | Discharge: 2015-07-12 | Disposition: A | Discharge status: Home / Self Care | Payer: Medicaid Other | Source: Ambulatory Visit | Attending: Obstetrics | Admitting: Obstetrics

## 2015-07-12 MED ORDER — OXYCODONE-ACETAMINOPHEN 5-325 MG PO TABS: 2.0000 | ORAL_TABLET | Freq: Once | ORAL | Status: DC

## 2015-07-12 NOTE — Progress Notes (Signed)
Recheck in an hour and call back with report.

## 2015-07-12 NOTE — MAU Note (Signed)
Notified Dr. Clearance CootsHarper patient presents for labor evaluation, no contractions on monitor cervix 2/50/-2, efm reactive, received order to discharge to home

## 2015-07-12 NOTE — Discharge Instructions (Signed)
Fetal Movement Counts °Patient Name: __________________________________________________ Patient Due Date: ____________________ °Performing a fetal movement count is highly recommended in high-risk pregnancies, but it is good for every pregnant woman to do. Your health care provider may ask you to start counting fetal movements at 28 weeks of the pregnancy. Fetal movements often increase: °· After eating a full meal. °· After physical activity. °· After eating or drinking something sweet or cold. °· At rest. °Pay attention to when you feel the baby is most active. This will help you notice a pattern of your baby's sleep and wake cycles and what factors contribute to an increase in fetal movement. It is important to perform a fetal movement count at the same time each day when your baby is normally most active.  °HOW TO COUNT FETAL MOVEMENTS °1. Find a quiet and comfortable area to sit or lie down on your left side. Lying on your left side provides the best blood and oxygen circulation to your baby. °2. Write down the day and time on a sheet of paper or in a journal. °3. Start counting kicks, flutters, swishes, rolls, or jabs in a 2-hour period. You should feel at least 10 movements within 2 hours. °4. If you do not feel 10 movements in 2 hours, wait 2-3 hours and count again. Look for a change in the pattern or not enough counts in 2 hours. °SEEK MEDICAL CARE IF: °· You feel less than 10 counts in 2 hours, tried twice. °· There is no movement in over an hour. °· The pattern is changing or taking longer each day to reach 10 counts in 2 hours. °· You feel the baby is not moving as he or she usually does. °Date: ____________ Movements: ____________ Start time: ____________ Finish time: ____________  °Date: ____________ Movements: ____________ Start time: ____________ Finish time: ____________ °Date: ____________ Movements: ____________ Start time: ____________ Finish time: ____________ °Date: ____________ Movements:  ____________ Start time: ____________ Finish time: ____________ °Date: ____________ Movements: ____________ Start time: ____________ Finish time: ____________ °Date: ____________ Movements: ____________ Start time: ____________ Finish time: ____________ °Date: ____________ Movements: ____________ Start time: ____________ Finish time: ____________ °Date: ____________ Movements: ____________ Start time: ____________ Finish time: ____________  °Date: ____________ Movements: ____________ Start time: ____________ Finish time: ____________ °Date: ____________ Movements: ____________ Start time: ____________ Finish time: ____________ °Date: ____________ Movements: ____________ Start time: ____________ Finish time: ____________ °Date: ____________ Movements: ____________ Start time: ____________ Finish time: ____________ °Date: ____________ Movements: ____________ Start time: ____________ Finish time: ____________ °Date: ____________ Movements: ____________ Start time: ____________ Finish time: ____________ °Date: ____________ Movements: ____________ Start time: ____________ Finish time: ____________  °Date: ____________ Movements: ____________ Start time: ____________ Finish time: ____________ °Date: ____________ Movements: ____________ Start time: ____________ Finish time: ____________ °Date: ____________ Movements: ____________ Start time: ____________ Finish time: ____________ °Date: ____________ Movements: ____________ Start time: ____________ Finish time: ____________ °Date: ____________ Movements: ____________ Start time: ____________ Finish time: ____________ °Date: ____________ Movements: ____________ Start time: ____________ Finish time: ____________ °Date: ____________ Movements: ____________ Start time: ____________ Finish time: ____________  °Date: ____________ Movements: ____________ Start time: ____________ Finish time: ____________ °Date: ____________ Movements: ____________ Start time: ____________ Finish  time: ____________ °Date: ____________ Movements: ____________ Start time: ____________ Finish time: ____________ °Date: ____________ Movements: ____________ Start time: ____________ Finish time: ____________ °Date: ____________ Movements: ____________ Start time: ____________ Finish time: ____________ °Date: ____________ Movements: ____________ Start time: ____________ Finish time: ____________ °Date: ____________ Movements: ____________ Start time: ____________ Finish time: ____________  °Date: ____________ Movements: ____________ Start time: ____________ Finish   time: ____________ °Date: ____________ Movements: ____________ Start time: ____________ Finish time: ____________ °Date: ____________ Movements: ____________ Start time: ____________ Finish time: ____________ °Date: ____________ Movements: ____________ Start time: ____________ Finish time: ____________ °Date: ____________ Movements: ____________ Start time: ____________ Finish time: ____________ °Date: ____________ Movements: ____________ Start time: ____________ Finish time: ____________ °Date: ____________ Movements: ____________ Start time: ____________ Finish time: ____________  °Date: ____________ Movements: ____________ Start time: ____________ Finish time: ____________ °Date: ____________ Movements: ____________ Start time: ____________ Finish time: ____________ °Date: ____________ Movements: ____________ Start time: ____________ Finish time: ____________ °Date: ____________ Movements: ____________ Start time: ____________ Finish time: ____________ °Date: ____________ Movements: ____________ Start time: ____________ Finish time: ____________ °Date: ____________ Movements: ____________ Start time: ____________ Finish time: ____________ °Date: ____________ Movements: ____________ Start time: ____________ Finish time: ____________  °Date: ____________ Movements: ____________ Start time: ____________ Finish time: ____________ °Date: ____________  Movements: ____________ Start time: ____________ Finish time: ____________ °Date: ____________ Movements: ____________ Start time: ____________ Finish time: ____________ °Date: ____________ Movements: ____________ Start time: ____________ Finish time: ____________ °Date: ____________ Movements: ____________ Start time: ____________ Finish time: ____________ °Date: ____________ Movements: ____________ Start time: ____________ Finish time: ____________ °Date: ____________ Movements: ____________ Start time: ____________ Finish time: ____________  °Date: ____________ Movements: ____________ Start time: ____________ Finish time: ____________ °Date: ____________ Movements: ____________ Start time: ____________ Finish time: ____________ °Date: ____________ Movements: ____________ Start time: ____________ Finish time: ____________ °Date: ____________ Movements: ____________ Start time: ____________ Finish time: ____________ °Date: ____________ Movements: ____________ Start time: ____________ Finish time: ____________ °Date: ____________ Movements: ____________ Start time: ____________ Finish time: ____________ °  °This information is not intended to replace advice given to you by your health care provider. Make sure you discuss any questions you have with your health care provider. °  °Document Released: 08/03/2006 Document Revised: 07/25/2014 Document Reviewed: 04/30/2012 °Elsevier Interactive Patient Education ©2016 [employer] °Vaginal Delivery °During delivery, your health care provider will help you give birth to your baby. During a vaginal delivery, you will work to push the baby out of your vagina. However, before you can push your baby out, a few things need to happen. The opening of your uterus (cervix) has to soften, thin out, and open up (dilate) all the way to 10 cm. Also, your baby has to move down from the uterus into your vagina.  °SIGNS OF LABOR  °Your health care provider will first need to make sure you  are in labor. Signs of labor include:  °· Passing what is called the mucous plug before labor begins. This is a small amount of blood-stained mucus. °· Having regular, painful uterine contractions.   °· The time between contractions gets shorter.   °· The discomfort and pain gradually get more intense. °· Contraction pains get worse when walking and do not go away when resting.   °· Your cervix becomes thinner (effacement) and dilates. °BEFORE THE DELIVERY °Once you are in labor and admitted into the hospital or care center, your health care provider may do the following:  °5. Perform a complete physical exam. °6. Review any complications related to pregnancy or labor.  °7. Check your blood pressure, pulse, temperature, and heart rate (vital signs).   °8. Determine if, and when, the rupture of amniotic membranes occurred. °9. Do a vaginal exam (using a sterile glove and lubricant) to determine:   °1. The position (presentation) of the baby. Is the baby's head presenting first (vertex) in the birth canal (vagina), or are the feet or buttocks first (breech)?   °2. The level (station) of the baby's head within   the birth canal.   °3. The effacement and dilatation of the cervix.   °10. An electronic fetal monitor is usually placed on your abdomen when you first arrive. This is used to monitor your contractions and the baby's heart rate. °1. When the monitor is on your abdomen (external fetal monitor), it can only pick up the frequency and length of your contractions. It cannot tell the strength of your contractions. °2. If it becomes necessary for your health care provider to know exactly how strong your contractions are or to see exactly what the baby's heart rate is doing, an internal monitor may be inserted into your vagina and uterus. Your health care provider will discuss the benefits and risks of using an internal monitor and obtain your permission before inserting the device. °3. Continuous fetal monitoring may be  needed if you have an epidural, are receiving certain medicines (such as oxytocin), or have pregnancy or labor complications. °11. An IV access tube may be placed into a vein in your arm to deliver fluids and medicines if necessary. °THREE STAGES OF LABOR AND DELIVERY °Normal labor and delivery is divided into three stages. °First Stage °This stage starts when you begin to contract regularly and your cervix begins to efface and dilate. It ends when your cervix is completely open (fully dilated). The first stage is the longest stage of labor and can last from 3 hours to 15 hours.  °Several methods are available to help with labor pain. You and your health care provider will decide which option is best for you. Options include:  °· Opioid medicines. These are strong pain medicines that you can get through your IV tube or as a shot into your muscle. These medicines lessen pain but do not make it go away completely.  °· Epidural. A medicine is given through a thin tube that is inserted in your back. The medicine numbs the lower part of your body and prevents any pain in that area. °· Paracervical pain medicine. This is an injection of an anesthetic on each side of your cervix.   °· You may request natural childbirth, which does not involve the use of pain medicines or an epidural during labor and delivery. Instead, you will use other things, such as breathing exercises, to help cope with the pain. °Second Stage °The second stage of labor begins when your cervix is fully dilated at 10 cm. It continues until you push your baby down through the birth canal and the baby is born. This stage can take only minutes or several hours. °· The location of your baby's head as it moves through the birth canal is reported as a number called a station. If the baby's head has not started its descent, the station is described as being at minus 3 (-3). When your baby's head is at the zero station, it is at the middle of the birth canal  and is engaged in the pelvis. The station of your baby helps indicate the progress of the second stage of labor. °· When your baby is born, your health care provider may hold the baby with his or her head lowered to prevent amniotic fluid, mucus, and blood from getting into the baby's lungs. The baby's mouth and nose may be suctioned with a small bulb syringe to remove any additional fluid. °· Your health care provider may then place the baby on your stomach. It is important to keep the baby from getting cold. To do this, the health care provider will dry   the baby off, place the baby directly on your skin (with no blankets between you and the baby), and cover the baby with warm, dry blankets.   °· The umbilical cord is cut. °Third Stage °During the third stage of labor, your health care provider will deliver the placenta (afterbirth) and make sure your bleeding is under control. The delivery of the placenta usually takes about 5 minutes but can take up to 30 minutes. After the placenta is delivered, a medicine may be given either by IV or injection to help contract the uterus and control bleeding. If you are planning to breastfeed, you can try to do so now. °After you deliver the placenta, your uterus should contract and get very firm. If your uterus does not remain firm, your health care provider will massage it. This is important because the contraction of the uterus helps cut off bleeding at the site where the placenta was attached to your uterus. If your uterus does not contract properly and stay firm, you may continue to bleed heavily. If there is a lot of bleeding, medicines may be given to contract the uterus and stop the bleeding.  °  °This information is not intended to replace advice given to you by your health care provider. Make sure you discuss any questions you have with your health care provider. °  °Document Released: 04/12/2008 Document Revised: 07/25/2014 Document Reviewed: 02/29/2012 °Elsevier  Interactive Patient Education ©2016 [employer] Inc. ° °

## 2015-07-12 NOTE — MAU Note (Signed)
Patient presents with loosing mucus plug and contractions every 30 minutes during the night.

## 2015-07-12 NOTE — MAU Note (Signed)
Patient presents to mau for labor evaluation. Endorses being seen earlier in the day and was sent home; sve earlier according to patient was 2cm/50%. States contractions have increased and would like to be rechecked. Denies VB or LOF. +FM.

## 2015-07-13 ENCOUNTER — Inpatient Hospital Stay (HOSPITAL_COMMUNITY): Payer: Medicaid Other | Admitting: Anesthesiology

## 2015-07-13 ENCOUNTER — Inpatient Hospital Stay (HOSPITAL_COMMUNITY)
Admission: AD | Admit: 2015-07-13 | Discharge: 2015-07-15 | DRG: 775 | Disposition: A | Discharge status: Home / Self Care | Payer: Medicaid Other | Source: Ambulatory Visit | Attending: Obstetrics | Admitting: Obstetrics

## 2015-07-13 ENCOUNTER — Encounter (HOSPITAL_COMMUNITY): Payer: Self-pay | Admitting: General Surgery

## 2015-07-13 DIAGNOSIS — O99214 Obesity complicating childbirth: Secondary | ICD-10-CM | POA: Diagnosis present

## 2015-07-13 DIAGNOSIS — Z6827 Body mass index (BMI) 27.0-27.9, adult: Secondary | ICD-10-CM | POA: Diagnosis not present

## 2015-07-13 DIAGNOSIS — Z3490 Encounter for supervision of normal pregnancy, unspecified, unspecified trimester: Secondary | ICD-10-CM

## 2015-07-13 DIAGNOSIS — E669 Obesity, unspecified: Secondary | ICD-10-CM | POA: Diagnosis present

## 2015-07-13 LAB — TYPE AND SCREEN
ABO/RH(D): O POS
ANTIBODY SCREEN: NEGATIVE

## 2015-07-13 LAB — CBC
HCT: 37.2 % (ref 36.0–46.0)
Hemoglobin: 12.5 g/dL (ref 12.0–15.0)
MCH: 31.1 pg (ref 26.0–34.0)
MCHC: 33.6 g/dL (ref 30.0–36.0)
MCV: 92.5 fL (ref 78.0–100.0)
PLATELETS: 197 10*3/uL (ref 150–400)
RBC: 4.02 MIL/uL (ref 3.87–5.11)
RDW: 13.3 % (ref 11.5–15.5)
WBC: 11 10*3/uL — AB (ref 4.0–10.5)

## 2015-07-13 LAB — ABO/RH: ABO/RH(D): O POS

## 2015-07-13 MED ORDER — ONDANSETRON HCL 4 MG/2ML IJ SOLN: 4.0000 mg | INTRAMUSCULAR | Status: DC | PRN

## 2015-07-13 MED ORDER — WITCH HAZEL-GLYCERIN EX PADS: 1.0000 "application " | MEDICATED_PAD | CUTANEOUS | Status: DC | PRN

## 2015-07-13 MED ORDER — CITRIC ACID-SODIUM CITRATE 334-500 MG/5ML PO SOLN: 30.0000 mL | ORAL | Status: DC | PRN

## 2015-07-13 MED ORDER — NALBUPHINE HCL 10 MG/ML IJ SOLN
10.0000 mg | Freq: Once | INTRAMUSCULAR | Status: AC | PRN
  Administered 2015-07-13: 10 mg via INTRAVENOUS
  Filled 2015-07-13: qty 1

## 2015-07-13 MED ORDER — OXYCODONE-ACETAMINOPHEN 5-325 MG PO TABS: 2.0000 | ORAL_TABLET | ORAL | Status: DC | PRN

## 2015-07-13 MED ORDER — BENZOCAINE-MENTHOL 20-0.5 % EX AERO: 1.0000 "application " | INHALATION_SPRAY | CUTANEOUS | Status: DC | PRN

## 2015-07-13 MED ORDER — OXYCODONE-ACETAMINOPHEN 5-325 MG PO TABS
1.0000 | ORAL_TABLET | ORAL | Status: DC | PRN
  Filled 2015-07-13: qty 1

## 2015-07-13 MED ORDER — FENTANYL 2.5 MCG/ML BUPIVACAINE 1/10 % EPIDURAL INFUSION (WH - ANES)
14.0000 mL/h | INTRAMUSCULAR | Status: DC | PRN
  Administered 2015-07-13: 14 mL/h via EPIDURAL
  Filled 2015-07-13: qty 125

## 2015-07-13 MED ORDER — LACTATED RINGERS IV SOLN: 500.0000 mL | INTRAVENOUS | Status: DC | PRN

## 2015-07-13 MED ORDER — ONDANSETRON HCL 4 MG PO TABS: 4.0000 mg | ORAL_TABLET | ORAL | Status: DC | PRN

## 2015-07-13 MED ORDER — OXYCODONE-ACETAMINOPHEN 5-325 MG PO TABS: 1.0000 | ORAL_TABLET | ORAL | Status: DC | PRN

## 2015-07-13 MED ORDER — ACETAMINOPHEN 325 MG PO TABS: 650.0000 mg | ORAL_TABLET | ORAL | Status: DC | PRN

## 2015-07-13 MED ORDER — LACTATED RINGERS IV SOLN
INTRAVENOUS | Status: DC
  Administered 2015-07-13: 08:00:00 via INTRAVENOUS

## 2015-07-13 MED ORDER — LIDOCAINE HCL (PF) 1 % IJ SOLN
30.0000 mL | INTRAMUSCULAR | Status: DC | PRN
  Filled 2015-07-13: qty 30

## 2015-07-13 MED ORDER — OXYTOCIN BOLUS FROM INFUSION
500.0000 mL | INTRAVENOUS | Status: DC
  Administered 2015-07-13: 500 mL via INTRAVENOUS

## 2015-07-13 MED ORDER — TERBUTALINE SULFATE 1 MG/ML IJ SOLN
0.2500 mg | Freq: Once | INTRAMUSCULAR | Status: DC | PRN
  Filled 2015-07-13: qty 1

## 2015-07-13 MED ORDER — SIMETHICONE 80 MG PO CHEW: 80.0000 mg | CHEWABLE_TABLET | ORAL | Status: DC | PRN

## 2015-07-13 MED ORDER — ACETAMINOPHEN 325 MG PO TABS
650.0000 mg | ORAL_TABLET | ORAL | Status: DC | PRN
Start: 2015-07-13 — End: 2015-07-15
  Administered 2015-07-13: 650 mg via ORAL
  Filled 2015-07-13 (×2): qty 2

## 2015-07-13 MED ORDER — SENNOSIDES-DOCUSATE SODIUM 8.6-50 MG PO TABS
2.0000 | ORAL_TABLET | ORAL | Status: DC
  Administered 2015-07-13 – 2015-07-15 (×2): 2 via ORAL
  Filled 2015-07-13 (×2): qty 2

## 2015-07-13 MED ORDER — PRENATAL MULTIVITAMIN CH
1.0000 | ORAL_TABLET | Freq: Every day | ORAL | Status: DC
  Filled 2015-07-13: qty 1

## 2015-07-13 MED ORDER — ZOLPIDEM TARTRATE 5 MG PO TABS: 5.0000 mg | ORAL_TABLET | Freq: Every evening | ORAL | Status: DC | PRN

## 2015-07-13 MED ORDER — TETANUS-DIPHTH-ACELL PERTUSSIS 5-2.5-18.5 LF-MCG/0.5 IM SUSP: 0.5000 mL | Freq: Once | INTRAMUSCULAR | Status: DC

## 2015-07-13 MED ORDER — PHENYLEPHRINE 40 MCG/ML (10ML) SYRINGE FOR IV PUSH (FOR BLOOD PRESSURE SUPPORT)
80.0000 ug | PREFILLED_SYRINGE | INTRAVENOUS | Status: DC | PRN
  Filled 2015-07-13: qty 20
  Filled 2015-07-13: qty 2

## 2015-07-13 MED ORDER — OXYTOCIN 40 UNITS IN LACTATED RINGERS INFUSION - SIMPLE MED
1.0000 m[IU]/min | INTRAVENOUS | Status: DC
  Administered 2015-07-13: 2 m[IU]/min via INTRAVENOUS

## 2015-07-13 MED ORDER — DIPHENHYDRAMINE HCL 25 MG PO CAPS: 25.0000 mg | ORAL_CAPSULE | Freq: Four times a day (QID) | ORAL | Status: DC | PRN

## 2015-07-13 MED ORDER — NALBUPHINE HCL 10 MG/ML IJ SOLN
10.0000 mg | Freq: Once | INTRAMUSCULAR | Status: AC | PRN
  Administered 2015-07-13: 10 mg via INTRAMUSCULAR
  Filled 2015-07-13: qty 1

## 2015-07-13 MED ORDER — IBUPROFEN 600 MG PO TABS
600.0000 mg | ORAL_TABLET | Freq: Four times a day (QID) | ORAL | Status: DC
  Administered 2015-07-13 – 2015-07-15 (×7): 600 mg via ORAL
  Filled 2015-07-13 (×7): qty 1

## 2015-07-13 MED ORDER — OXYTOCIN 40 UNITS IN LACTATED RINGERS INFUSION - SIMPLE MED: 62.5000 mL/h | INTRAVENOUS | Status: DC | PRN

## 2015-07-13 MED ORDER — DIBUCAINE 1 % RE OINT: 1.0000 "application " | TOPICAL_OINTMENT | RECTAL | Status: DC | PRN

## 2015-07-13 MED ORDER — LIDOCAINE HCL (PF) 1 % IJ SOLN
INTRAMUSCULAR | Status: DC | PRN
  Administered 2015-07-13: 6 mL via EPIDURAL
  Administered 2015-07-13: 9 mL via EPIDURAL

## 2015-07-13 MED ORDER — DIPHENHYDRAMINE HCL 50 MG/ML IJ SOLN: 12.5000 mg | INTRAMUSCULAR | Status: DC | PRN

## 2015-07-13 MED ORDER — ONDANSETRON HCL 4 MG/2ML IJ SOLN: 4.0000 mg | Freq: Four times a day (QID) | INTRAMUSCULAR | Status: DC | PRN

## 2015-07-13 MED ORDER — LANOLIN HYDROUS EX OINT: TOPICAL_OINTMENT | CUTANEOUS | Status: DC | PRN

## 2015-07-13 MED ORDER — PROMETHAZINE HCL 25 MG/ML IJ SOLN
25.0000 mg | Freq: Once | INTRAMUSCULAR | Status: AC | PRN
  Administered 2015-07-13: 25 mg via INTRAMUSCULAR
  Filled 2015-07-13: qty 1

## 2015-07-13 MED ORDER — EPHEDRINE 5 MG/ML INJ
10.0000 mg | INTRAVENOUS | Status: DC | PRN
  Filled 2015-07-13: qty 2

## 2015-07-13 MED ORDER — OXYTOCIN 40 UNITS IN LACTATED RINGERS INFUSION - SIMPLE MED
62.5000 mL/h | INTRAVENOUS | Status: DC
  Filled 2015-07-13: qty 1000

## 2015-07-13 NOTE — Lactation Note (Addendum)
This note was copied from the chart of Julia Costa. Lactation Consultation Note  Patient Name: Julia Costa ZOXWR'UToday's Date: 07/13/2015 Reason for consult: Initial assessment Baby at 4 hr of life and mom reports feedings are going well and baby is latching "perfect". She bf her older children with no complications. Mom does have closely spaced, tubular breast, with a very large areola, and short shafted, almost flat nipples. Denies breast or nipple pain. Voiced no concerns. Discussed baby behavior, feeding frequency, baby belly size, voids, wt loss, breast changes, and nipple care. Demonstrated manual expression, colostrum noted bilaterally. Given lactation handouts. Aware of OP services and support group. She will call as needed for bf help.      Maternal Data Has patient been taught Hand Expression?: Yes Does the patient have breastfeeding experience prior to this delivery?: Yes  Feeding Feeding Type: Breast Fed Length of feed: 10 min  LATCH Score/Interventions Latch:  (instructed mother to call for next latch)                    Lactation Tools Discussed/Used WIC Program: Yes   Consult Status Consult Status: Follow-up Date: 07/14/15 Follow-up type: In-patient    Julia Costa 07/13/2015, 5:25 PM

## 2015-07-13 NOTE — Anesthesia Preprocedure Evaluation (Signed)
Anesthesia Evaluation  Patient identified by MRN, date of birth, ID band Patient awake    Reviewed: Allergy & Precautions, H&P , NPO status , Patient's Chart, lab work & pertinent test results  Airway Mallampati: I  TM Distance: >3 FB Neck ROM: full    Dental no notable dental hx.    Pulmonary neg pulmonary ROS,    Pulmonary exam normal        Cardiovascular negative cardio ROS Normal cardiovascular exam     Neuro/Psych negative neurological ROS     GI/Hepatic negative GI ROS, Neg liver ROS,   Endo/Other  negative endocrine ROS  Renal/GU negative Renal ROS     Musculoskeletal   Abdominal (+) + obese,   Peds  Hematology negative hematology ROS (+)   Anesthesia Other Findings   Reproductive/Obstetrics (+) Pregnancy                             Anesthesia Physical Anesthesia Plan  ASA: II  Anesthesia Plan: Epidural   Post-op Pain Management:    Induction:   Airway Management Planned:   Additional Equipment:   Intra-op Plan:   Post-operative Plan:   Informed Consent: I have reviewed the patients History and Physical, chart, labs and discussed the procedure including the risks, benefits and alternatives for the proposed anesthesia with the patient or authorized representative who has indicated his/her understanding and acceptance.     Plan Discussed with:   Anesthesia Plan Comments:         Anesthesia Quick Evaluation

## 2015-07-13 NOTE — H&P (Signed)
Julia FerrierLataisha Costa is a 24 y.o. female presenting for UC's. Maternal Medical History:  Reason for admission: Contractions.   Contractions: Frequency: regular.   Perceived severity is moderate.    Fetal activity: Perceived fetal activity is normal.   Last perceived fetal movement was within the past hour.    Prenatal complications: no prenatal complications Prenatal Complications - Diabetes: none.    OB History    Gravida Para Term Preterm AB TAB SAB Ectopic Multiple Living   4 2 2  1     2      No past medical history on file. Past Surgical History  Procedure Laterality Date  . No past surgeries     Family History: family history is not on file. Social History:  reports that she has never smoked. She does not have any smokeless tobacco history on file. She reports that she does not drink alcohol or use illicit drugs.   Prenatal Transfer Tool  Maternal Diabetes: No Genetic Screening: Normal Maternal Ultrasounds/Referrals: Normal Fetal Ultrasounds or other Referrals:  None Maternal Substance Abuse:  No Significant Maternal Medications:  None Significant Maternal Lab Results:  None Other Comments:  None  Review of Systems  All other systems reviewed and are negative.   Dilation: 5.5 Effacement (%): 60 Station: -2 Exam by::  Julia Costa(Amber Knox, Charity fundraiserN) Last menstrual period 10/05/2014. Maternal Exam:  Uterine Assessment: Contraction strength is moderate.  Contraction frequency is regular.   Abdomen: Patient reports no abdominal tenderness. Fetal presentation: vertex  Introitus: Normal vulva. Normal vagina.  Pelvis: adequate for delivery.   Cervix: Cervix evaluated by digital exam.     Physical Exam  Nursing note and vitals reviewed. Constitutional: She is oriented to person, place, and time. She appears well-developed and well-nourished.  HENT:  Head: Normocephalic and atraumatic.  Eyes: Conjunctivae are normal. Pupils are equal, round, and reactive to light.  Neck: Normal  range of motion. Neck supple.  Cardiovascular: Normal rate and regular rhythm.   Respiratory: Effort normal and breath sounds normal.  GI: Soft. Bowel sounds are normal.  Genitourinary: Vagina normal and uterus normal.  Musculoskeletal: Normal range of motion.  Neurological: She is alert and oriented to person, place, and time.  Skin: Skin is warm and dry.  Psychiatric: She has a normal mood and affect. Her behavior is normal. Judgment and thought content normal.    Prenatal labs: ABO, Rh: --/--/O POS (04/25 1435) Antibody: Negative (10/13 0000) Rubella: Immune (10/13 0000) RPR: Nonreactive (10/13 0000)  HBsAg: Negative (10/13 0000)  HIV: Non Reactive (04/25 1435)  GBS: Negative (12/01 0000)   Assessment/Plan: 39 weeks.  Active labor.  Admit.   HARPER,CHARLES A 07/13/2015, 7:18 AM

## 2015-07-13 NOTE — Progress Notes (Signed)
Julia FerrierLataisha Costa is Julia 24 y.o. [phone] at 299w3d by LMP admitted for active labor  Subjective:   Objective: BP 128/73 mmHg  Pulse 70  Temp(Src) 97.8 F (36.6 C) (Oral)  Resp 17  Ht 5\' 5"  (1.651 m)  Wt 165 lb (74.844 kg)  BMI 27.46 kg/m2  SpO2 100%  LMP 10/05/2014      FHT:  FHR: 135 bpm, variability: moderate,  accelerations:  Present,  decelerations:  Absent UC:   regular, every 2-4 minutes SVE:   Dilation: 8 Effacement (%): 90 Station: 0 Exam by:: Julia Spenski, RN & Julia CornerA Davis, RN  Labs: Lab Results  Component Value Date   WBC 11.0* 07/13/2015   HGB 12.5 07/13/2015   HCT 37.2 07/13/2015   MCV 92.5 07/13/2015   PLT 197 07/13/2015    Assessment / Plan: Active labor.  Expectant management. Augmentation of labor, progressing well  Labor: Progressing normally Preeclampsia:  n/Julia Fetal Wellbeing:  Category I Pain Control:  Epidural I/D:  n/Julia Anticipated MOD:  NSVD  Julia Costa Julia 07/13/2015, 11:59 AM

## 2015-07-13 NOTE — Anesthesia Postprocedure Evaluation (Signed)
Anesthesia Post Note  Patient: Julia FerrierLataisha Moore  Procedure(s) Performed: * No procedures listed *  Patient location during evaluation: Mother Baby Anesthesia Type: Epidural Level of consciousness: awake Pain management: satisfactory to patient Vital Signs Assessment: post-procedure vital signs reviewed and stable Respiratory status: spontaneous breathing Cardiovascular status: stable Anesthetic complications: no    Last Vitals:  Filed Vitals:   07/13/15 1524 07/13/15 1620  BP: 116/74 124/55  Pulse: 73 66  Temp: 36.4 C 36.4 C  Resp: 16 16    Last Pain:  Filed Vitals:   07/13/15 1734  PainSc: 0-No pain                 Makinlee Awwad

## 2015-07-13 NOTE — Anesthesia Procedure Notes (Signed)
Epidural Patient location during procedure: OB Start time: 07/13/2015 11:26 AM End time: 07/13/2015 11:30 AM  Staffing Anesthesiologist: Leilani AbleHATCHETT, Vincentina Sollers Performed by: anesthesiologist   Preanesthetic Checklist Completed: patient identified, surgical consent, pre-op evaluation, timeout performed, IV checked, risks and benefits discussed and monitors and equipment checked  Epidural Patient position: sitting Prep: site prepped and draped and DuraPrep Patient monitoring: continuous pulse ox and blood pressure Approach: midline Location: L3-L4 Injection technique: LOR air  Needle:  Needle type: Tuohy  Needle gauge: 17 G Needle length: 9 cm and 9 Needle insertion depth: 6 cm Catheter type: closed end flexible Catheter size: 19 Gauge Catheter at skin depth: 11 cm Test dose: negative and Other  Assessment Sensory level: T9 Events: blood not aspirated, injection not painful, no injection resistance, negative IV test and no paresthesia  Additional Notes Reason for block:procedure for pain

## 2015-07-14 LAB — RPR: RPR Ser Ql: NONREACTIVE

## 2015-07-14 LAB — CBC
HCT: 35.5 % — ABNORMAL LOW (ref 36.0–46.0)
Hemoglobin: 12.2 g/dL (ref 12.0–15.0)
MCH: 31.6 pg (ref 26.0–34.0)
MCHC: 34.4 g/dL (ref 30.0–36.0)
MCV: 92 fL (ref 78.0–100.0)
PLATELETS: 172 10*3/uL (ref 150–400)
RBC: 3.86 MIL/uL — AB (ref 3.87–5.11)
RDW: 13.5 % (ref 11.5–15.5)
WBC: 12.6 10*3/uL — AB (ref 4.0–10.5)

## 2015-07-14 NOTE — Progress Notes (Signed)
CSW acknowledges consult for history of depression, anxiety, and postpartum depression.  CSW attempted to meet with MOB; however, CSW offered to return at a later time since there were numerous visitors in the room. MOB agreeable.   CSW to follow up on 12/28.  Loleta BooksSarah Vincie Linn MSW, LCSW [phone]

## 2015-07-14 NOTE — Progress Notes (Signed)
Patient ID: Lynnea FerrierLataisha Costa, female   DOB: 1991-02-08, 24 y.o.   MRN: [id number] Postpartum day one Vital signs normal Fundus firm Lochia moderate Doing well

## 2015-07-14 NOTE — Lactation Note (Signed)
This note was copied from the chart of Julia Costa. Lactation Consultation Note  Patient Name: Julia Costa UJWJX'BToday's Date: 07/14/2015 Reason for consult: Follow-up assessment Baby at 32 hr of life and mom reports bf is going well. There have been no wet diapers have been recorded yet. Both parents think that the baby has urinated when she had a BM but it was just not reported. Baby has a wet mouth with bubbles. Her fontanel and eyes appear normal. She has good skin turgor. She has been to breast 12 times over the last 24 hr. Mom reports she can hear and see the baby swallow. Discussed manual expression and spoon feeding for a little extra. Encouraged mom to offer the breast on demand 8+/24hr. She will call with concerns.    Maternal Data    Feeding Feeding Type: Breast Fed Length of feed: 10 min  LATCH Score/Interventions                      Lactation Tools Discussed/Used     Consult Status Consult Status: Follow-up Date: 07/15/15 Follow-up type: In-patient    Rulon Eisenmengerlizabeth E Sydney Hasten 07/14/2015, 8:49 PM

## 2015-07-15 NOTE — Lactation Note (Deleted)
This note was copied from the chart of Julia Costa. Lactation Consultation Note  [employer] Interpreter Swahili. Mother's breasts are filling.  She was able to easily hand express. Encouraged mother to breastfeed baby. Provided pillows for mother and baby latched after a few attempts in cradle hold. Sucks and swallows observed.  Helped increase depth of latch. Gave mother a hand pump to use. Reviewed engorgement care and monitoring voids/stools.   Patient Name: Julia Costa UJWJX'BToday's Date: 07/15/2015 Reason for consult: Follow-up assessment   Maternal Data    Feeding Feeding Type: Breast Fed  LATCH Score/Interventions Latch: Grasps breast easily, tongue down, lips flanged, rhythmical sucking.  Audible Swallowing: Spontaneous and intermittent Intervention(s): Hand expression  Type of Nipple: Everted at rest and after stimulation  Comfort (Breast/Nipple): Soft / non-tender     Hold (Positioning): Assistance needed to correctly position infant at breast and maintain latch.  LATCH Score: 9  Lactation Tools Discussed/Used     Consult Status Consult Status: Complete    Hardie PulleyBerkelhammer, Ruth Boschen 07/15/2015, 9:51 AM

## 2015-07-15 NOTE — Discharge Summary (Signed)
Obstetric Discharge Summary Reason for Admission: onset of labor Prenatal Procedures: none Intrapartum Procedures: spontaneous vaginal delivery Postpartum Procedures: none Complications-Operative and Postpartum: none HEMOGLOBIN  Date Value Ref Range Status  07/14/2015 12.2 12.0 - 15.0 g/dL Final   HCT  Date Value Ref Range Status  07/14/2015 35.5* 36.0 - 46.0 % Final    Physical Exam:  General: alert Lochia: appropriate Uterine Fundus: firm Incision: healing well DVT Evaluation: No evidence of DVT seen on physical exam.  Discharge Diagnoses: Term Pregnancy-delivered  Discharge Information: Date: 07/15/2015 Activity: pelvic rest Diet: routine Medications: Percocet Condition: improved Instructions: refer to practice specific booklet Discharge to: home Follow-up Information    Follow up with Kathreen CosierMARSHALL,Ranessa Kosta A, MD.   Specialty:  Obstetrics and Gynecology   Contact information:   [address] [place] [address] [phone]       Newborn Data: Live born female  Birth Weight: 6 lb 9.8 oz (3000 g) APGAR: 8, 10  Home with mother.  Chadley Dziedzic A 07/15/2015, 4:57 AM

## 2015-07-15 NOTE — Clinical Social Work Maternal (Signed)
CLINICAL SOCIAL WORK MATERNAL/CHILD NOTE  Patient Details  Name: Julia Costa MRN: [id number] Date of Birth: 08/14/1990  Date:  2014/09/09  Clinical Social Worker Initiating Note:  Lucita Ferrara MSW, LCSW Date/ Time Initiated:  07/15/15/0845     Child's Name:  Julia Costa   Legal Guardian:  Julia Costa and Julia Costa  Need for Interpreter:  None   Date of Referral:  15-Aug-2014     Reason for Referral:  History of depression, anxiety, and SI early in pregnancy  Referral Source:  [facility]   Address:  [address], [place] [address]  Phone number:  [phone]   Household Members:  Minor Children, Significant Other   Natural Supports (not living in the home):  Immediate Family, Friends   Medical illustrator Supports: None   Employment: Unemployed   Type of Work:     Education:      Pensions consultant:  [employer]   Other Resources:  [employer]   Cultural/Religious Considerations Which May Impact Care:  None reported  Strengths:  Ability to meet basic needs , Home prepared for child , Pediatrician chosen    Risk Factors/Current Problems:   1. Mental Health Concerns: MOB presents with history of depression, anxiety, and SI early in the pregnancy.      Cognitive State:  Able to Concentrate , Alert , Goal Oriented , Linear Thinking , Insightful    Mood/Affect:  Calm , Comfortable , Happy    CSW Assessment:  CSW received request for consult due to MOB presenting with a history of depression, anxiety, and SI early in pregnancy.  MOB provided consent for FOB to remain in the room during the assessment, and both presented as easily engaged and receptive to the visit. The MOB displayed a full range in affect, was in a pleasant mood, and was observed to be smiling and interacting with the infant.   MOB and FOB expressed feelings of eagerness and excitement to be preparing for discharge.  MOB reflected upon her childbirth experience with sense of frustration  since her placenta was thrown away. MOB and FOB discussed plans for the placenta, and FOB endorsed feelings of loss.  MOB and FOB discussed efforts to not be upset, but acknowledged the frustration they feel.  MOB and FOB informed of ability to voice their concerns to the office of patient experience during call-backs.   FOB and MOB reported that they have limited support in [place]. MOB shared that she is originally from [place], but the FOB reported that he has some uncles that live nearby. MOB and FOB reported that they feel content with their current support since despite a small support system, they believe it is strong.  MOB and FOB confirmed that the home is prepared for the infant, and all needs are met.  MOB and FOB endorsed presence of housing stress, since they live near a loud neighbor.  CSW provided supportive listening as they discussed their stressors, and began to explore with family options to resolve stress. CSW assisted the family to explore what is within their control that may assist the situation. MOB and FOB expressed goal of exploring new housing since they are not bound within a long term lease.  CSW inquired about history of postpartum depression that is listed in her chart. MOB stated that she is unsure if it was postpartum depression, or closely linked to a negative situation. She stated that the father of her first two children was the source of her stress, and discussed impressions  that mental health complications were not present when she was not around him.   MOB confirmed increase in depression, anxiety, and presence of SI when she first learned that she was pregnant. MOB endorsed belief that it was also situational since the FOB's mother had recently died, she just lost her job, and she felt overwhelmed with the news of the pregnancy. She also recognized the role of hormones in her mental health. MOB stated that due to how she felt, she went to [facility] to receive a mental health  evaluation, but was discharged with outpatient referrals. MOB denied need to follow up with any referrals, and shared that symptoms improved as she increased communication with the FOB and processed the numerous stressors.  CSW provided education on perinatal mood disorders, and shared impressions of increased risk due to prior history, and symptoms early in the pregnancy.  MOB denied recent mental health complications, and again shared her impressions that it was situational.  CSW normalized symptoms, and encouraged her to talk to her medical provider if she notes increase in severity and frequency, and if normative methods of coping are ineffective. MOB agreed, expressed appreciation for the visit, and requested CSW contact information if additional needs arise. CSW provided.  CSW Plan/Description:   1. Patient/Family Education- perinatal mood and anxiety disorders  2.No Further Intervention Required/No Barriers to Discharge    Sharyl Nimrod 06-12-2015, 10:25 AM

## 2015-07-15 NOTE — Discharge Instructions (Signed)
Discharge instructions   You can wash your hair  Shower  Eat what you want  Drink what you want  See me in 6 weeks  Your ankles are going to swell more in the next 2 weeks than when pregnant  No sex for 6 weeks   Chryl Holten A, MD 07/15/2015

## 2015-07-15 NOTE — Lactation Note (Signed)
This note was copied from the chart of Julia Costa. Lactation Consultation Note  Mother denies problems or questions. She has been only breastfeeding and wants to try to breastfeed this child longer. Mom encouraged to feed baby 8-12 times/24 hours and with feeding cues.  Reviewed engorgement care and monitoring voids/stools.   Patient Name: Julia Costa NFAOZ'HToday's Date: 07/15/2015 Reason for consult: Follow-up assessment   Maternal Data    Feeding Feeding Type: Breast Fed Length of feed: 10 min  LATCH Score/Interventions                      Lactation Tools Discussed/Used     Consult Status Consult Status: Complete    Julia Costa, Julia Costa 07/15/2015, 10:15 AM

## 2015-07-15 NOTE — Progress Notes (Signed)
Patient ID: Julia FerrierLataisha Costa, female   DOB: [birth year], 24 y.o.   MRN: [id number] Postpartum day 2 Blood pressure 120/80 afebrile pulse 78 respiration 18 Fundus firm Legs negative doing well home today

## 2016-12-02 ENCOUNTER — Inpatient Hospital Stay (HOSPITAL_COMMUNITY)
Admission: AD | Admit: 2016-12-02 | Discharge: 2016-12-02 | Disposition: A | Discharge status: Home / Self Care | Payer: Medicaid Other | Source: Ambulatory Visit | Attending: Obstetrics & Gynecology | Admitting: Obstetrics & Gynecology

## 2016-12-02 ENCOUNTER — Inpatient Hospital Stay (HOSPITAL_COMMUNITY): Payer: Medicaid Other

## 2016-12-02 ENCOUNTER — Encounter (HOSPITAL_COMMUNITY): Payer: Self-pay | Admitting: *Deleted

## 2016-12-02 DIAGNOSIS — Z3A01 Less than 8 weeks gestation of pregnancy: Secondary | ICD-10-CM | POA: Insufficient documentation

## 2016-12-02 DIAGNOSIS — O3680X0 Pregnancy with inconclusive fetal viability, not applicable or unspecified: Secondary | ICD-10-CM

## 2016-12-02 DIAGNOSIS — O208 Other hemorrhage in early pregnancy: Secondary | ICD-10-CM | POA: Diagnosis not present

## 2016-12-02 DIAGNOSIS — O9989 Other specified diseases and conditions complicating pregnancy, childbirth and the puerperium: Secondary | ICD-10-CM

## 2016-12-02 DIAGNOSIS — R109 Unspecified abdominal pain: Secondary | ICD-10-CM

## 2016-12-02 DIAGNOSIS — O26891 Other specified pregnancy related conditions, first trimester: Secondary | ICD-10-CM | POA: Insufficient documentation

## 2016-12-02 DIAGNOSIS — R102 Pelvic and perineal pain: Secondary | ICD-10-CM

## 2016-12-02 DIAGNOSIS — O418X10 Other specified disorders of amniotic fluid and membranes, first trimester, not applicable or unspecified: Secondary | ICD-10-CM

## 2016-12-02 DIAGNOSIS — N898 Other specified noninflammatory disorders of vagina: Secondary | ICD-10-CM | POA: Diagnosis not present

## 2016-12-02 DIAGNOSIS — O468X1 Other antepartum hemorrhage, first trimester: Secondary | ICD-10-CM

## 2016-12-02 LAB — URINALYSIS, ROUTINE W REFLEX MICROSCOPIC
Bilirubin Urine: NEGATIVE
Glucose, UA: NEGATIVE mg/dL
KETONES UR: NEGATIVE mg/dL
LEUKOCYTES UA: NEGATIVE
Nitrite: NEGATIVE
Protein, ur: NEGATIVE mg/dL
SPECIFIC GRAVITY, URINE: 1.01 (ref 1.005–1.030)
pH: 7 (ref 5.0–8.0)

## 2016-12-02 LAB — CBC
HEMATOCRIT: 38.6 % (ref 36.0–46.0)
Hemoglobin: 13.3 g/dL (ref 12.0–15.0)
MCH: 30.6 pg (ref 26.0–34.0)
MCHC: 34.5 g/dL (ref 30.0–36.0)
MCV: 88.7 fL (ref 78.0–100.0)
Platelets: 239 10*3/uL (ref 150–400)
RBC: 4.35 MIL/uL (ref 3.87–5.11)
RDW: 12.7 % (ref 11.5–15.5)
WBC: 6.3 10*3/uL (ref 4.0–10.5)

## 2016-12-02 LAB — WET PREP, GENITAL
Clue Cells Wet Prep HPF POC: NONE SEEN
Sperm: NONE SEEN
TRICH WET PREP: NONE SEEN

## 2016-12-02 LAB — POCT PREGNANCY, URINE: Preg Test, Ur: POSITIVE — AB

## 2016-12-02 LAB — HCG, QUANTITATIVE, PREGNANCY: hCG, Beta Chain, Quant, S: 15810 m[IU]/mL — ABNORMAL HIGH (ref <5)

## 2016-12-02 NOTE — Discharge Instructions (Signed)
First Trimester of Pregnancy The first trimester of pregnancy is from week 1 until the end of week 13 (months 1 through 3). A week after a sperm fertilizes an egg, the egg will implant on the wall of the uterus. This embryo will begin to develop into a baby. Genes from you and your partner will form the baby. The female genes will determine whether the baby will be a boy or a girl. At 6-8 weeks, the eyes and face will be formed, and the heartbeat can be seen on ultrasound. At the end of 12 weeks, all the baby's organs will be formed. Now that you are pregnant, you will want to do everything you can to have a healthy baby. Two of the most important things are to get good prenatal care and to follow your health care provider's instructions. Prenatal care is all the medical care you receive before the baby's birth. This care will help prevent, find, and treat any problems during the pregnancy and childbirth. Body changes during your first trimester Your body goes through many changes during pregnancy. The changes vary from woman to woman.  You may gain or lose a couple of pounds at first.  You may feel sick to your stomach (nauseous) and you may throw up (vomit). If the vomiting is uncontrollable, call your health care provider.  You may tire easily.  You may develop headaches that can be relieved by medicines. All medicines should be approved by your health care provider.  You may urinate more often. Painful urination may mean you have a bladder infection.  You may develop heartburn as a result of your pregnancy.  You may develop constipation because certain hormones are causing the muscles that push stool through your intestines to slow down.  You may develop hemorrhoids or swollen veins (varicose veins).  Your breasts may begin to grow larger and become tender. Your nipples may stick out more, and the tissue that surrounds them (areola) may become darker.  Your gums may bleed and may be  sensitive to brushing and flossing.  Dark spots or blotches (chloasma, mask of pregnancy) may develop on your face. This will likely fade after the baby is born.  Your menstrual periods will stop.  You may have a loss of appetite.  You may develop cravings for certain kinds of food.  You may have changes in your emotions from day to day, such as being excited to be pregnant or being concerned that something may go wrong with the pregnancy and baby.  You may have more vivid and strange dreams.  You may have changes in your hair. These can include thickening of your hair, rapid growth, and changes in texture. Some women also have hair loss during or after pregnancy, or hair that feels dry or thin. Your hair will most likely return to normal after your baby is born.  What to expect at prenatal visits During a routine prenatal visit:  You will be weighed to make sure you and the baby are growing normally.  Your blood pressure will be taken.  Your abdomen will be measured to track your baby's growth.  The fetal heartbeat will be listened to between weeks 10 and 14 of your pregnancy.  Test results from any previous visits will be discussed.  Your health care provider may ask you:  How you are feeling.  If you are feeling the baby move.  If you have had any abnormal symptoms, such as leaking fluid, bleeding, severe headaches,   or abdominal cramping.  If you are using any tobacco products, including cigarettes, chewing tobacco, and electronic cigarettes.  If you have any questions.  Other tests that may be performed during your first trimester include:  Blood tests to find your blood type and to check for the presence of any previous infections. The tests will also be used to check for low iron levels (anemia) and protein on red blood cells (Rh antibodies). Depending on your risk factors, or if you previously had diabetes during pregnancy, you may have tests to check for high blood  sugar that affects pregnant women (gestational diabetes).  Urine tests to check for infections, diabetes, or protein in the urine.  An ultrasound to confirm the proper growth and development of the baby.  Fetal screens for spinal cord problems (spina bifida) and Down syndrome.  HIV (human immunodeficiency virus) testing. Routine prenatal testing includes screening for HIV, unless you choose not to have this test.  You may need other tests to make sure you and the baby are doing well.  Follow these instructions at home: Medicines  Follow your health care provider's instructions regarding medicine use. Specific medicines may be either safe or unsafe to take during pregnancy.  Take a prenatal vitamin that contains at least 600 micrograms (mcg) of folic acid.  If you develop constipation, try taking a stool softener if your health care provider approves. Eating and drinking  Eat a balanced diet that includes fresh fruits and vegetables, whole grains, good sources of protein such as meat, eggs, or tofu, and low-fat dairy. Your health care provider will help you determine the amount of weight gain that is right for you.  Avoid raw meat and uncooked cheese. These carry germs that can cause birth defects in the baby.  Eating four or five small meals rather than three large meals a day may help relieve nausea and vomiting. If you start to feel nauseous, eating a few soda crackers can be helpful. Drinking liquids between meals, instead of during meals, also seems to help ease nausea and vomiting.  Limit foods that are high in fat and processed sugars, such as fried and sweet foods.  To prevent constipation: ? Eat foods that are high in fiber, such as fresh fruits and vegetables, whole grains, and beans. ? Drink enough fluid to keep your urine clear or pale yellow. Activity  Exercise only as directed by your health care provider. Most women can continue their usual exercise routine during  pregnancy. Try to exercise for 30 minutes at least 5 days a week. Exercising will help you: ? Control your weight. ? Stay in shape. ? Be prepared for labor and delivery.  Experiencing pain or cramping in the lower abdomen or lower back is a good sign that you should stop exercising. Check with your health care provider before continuing with normal exercises.  Try to avoid standing for long periods of time. Move your legs often if you must stand in one place for a long time.  Avoid heavy lifting.  Wear low-heeled shoes and practice good posture.  You may continue to have sex unless your health care provider tells you not to. Relieving pain and discomfort  Wear a good support bra to relieve breast tenderness.  Take warm sitz baths to soothe any pain or discomfort caused by hemorrhoids. Use hemorrhoid cream if your health care provider approves.  Rest with your legs elevated if you have leg cramps or low back pain.  If you develop   varicose veins in your legs, wear support hose. Elevate your feet for 15 minutes, 3-4 times a day. Limit salt in your diet. Prenatal care  Schedule your prenatal visits by the twelfth week of pregnancy. They are usually scheduled monthly at first, then more often in the last 2 months before delivery.  Write down your questions. Take them to your prenatal visits.  Keep all your prenatal visits as told by your health care provider. This is important. Safety  Wear your seat belt at all times when driving.  Make a list of emergency phone numbers, including numbers for family, friends, the hospital, and police and fire departments. General instructions  Ask your health care provider for a referral to a local prenatal education class. Begin classes no later than the beginning of month 6 of your pregnancy.  Ask for help if you have counseling or nutritional needs during pregnancy. Your health care provider can offer advice or refer you to specialists for help  with various needs.  Do not use hot tubs, steam rooms, or saunas.  Do not douche or use tampons or scented sanitary pads.  Do not cross your legs for long periods of time.  Avoid cat litter boxes and soil used by cats. These carry germs that can cause birth defects in the baby and possibly loss of the fetus by miscarriage or stillbirth.  Avoid all smoking, herbs, alcohol, and medicines not prescribed by your health care provider. Chemicals in these products affect the formation and growth of the baby.  Do not use any products that contain nicotine or tobacco, such as cigarettes and e-cigarettes. If you need help quitting, ask your health care provider. You may receive counseling support and other resources to help you quit.  Schedule a dentist appointment. At home, brush your teeth with a soft toothbrush and be gentle when you floss. Contact a health care provider if:  You have dizziness.  You have mild pelvic cramps, pelvic pressure, or nagging pain in the abdominal area.  You have persistent nausea, vomiting, or diarrhea.  You have a bad smelling vaginal discharge.  You have pain when you urinate.  You notice increased swelling in your face, hands, legs, or ankles.  You are exposed to fifth disease or chickenpox.  You are exposed to German measles (rubella) and have never had it. Get help right away if:  You have a fever.  You are leaking fluid from your vagina.  You have spotting or bleeding from your vagina.  You have severe abdominal cramping or pain.  You have rapid weight gain or loss.  You vomit blood or material that looks like coffee grounds.  You develop a severe headache.  You have shortness of breath.  You have any kind of trauma, such as from a fall or a car accident. Summary  The first trimester of pregnancy is from week 1 until the end of week 13 (months 1 through 3).  Your body goes through many changes during pregnancy. The changes vary from  woman to woman.  You will have routine prenatal visits. During those visits, your health care provider will examine you, discuss any test results you may have, and talk with you about how you are feeling. This information is not intended to replace advice given to you by your health care provider. Make sure you discuss any questions you have with your health care provider. Document Released: 06/28/2001 Document Revised: 06/15/2016 Document Reviewed: 06/15/2016 Elsevier Interactive Patient Education  2017 [employer]  

## 2016-12-02 NOTE — MAU Provider Note (Signed)
Chief Complaint: Abdominal Pain   First Provider Initiated Contact with Patient 12/02/16 1609        SUBJECTIVE HPI: Julia Costa is a 26 y.o. [id number] at Unknown by LMP who presents to maternity admissions reporting Pelvic cramping in setting of positive pregnancy test.  . She denies vaginal bleeding, vaginal itching/burning, urinary symptoms, h/a, dizziness, n/v, or fever/chills.    Abdominal Pain  This is a new problem. The current episode started 1 to 4 weeks ago. The onset quality is gradual. The problem occurs intermittently. The problem has been unchanged. The pain is located in the suprapubic region, LLQ and RLQ. The pain is mild. The quality of the pain is cramping. The abdominal pain does not radiate. Pertinent negatives include no constipation, diarrhea, fever, headaches, myalgias, nausea or vomiting. Nothing aggravates the pain. The pain is relieved by nothing. She has tried nothing for the symptoms.    RN Note: Pt reports she had a positive HPT at the begining of the month. Julia Costa been having abd cramping on and off since the end of April. Denies any vaginal bleeding. Reports some white vaginal discharge  History reviewed. No pertinent past medical history. Past Surgical History:  Procedure Laterality Date  . NO PAST SURGERIES     Social History   Social History  . Marital status: Single    Spouse name: N/A  . Number of children: N/A  . Years of education: N/A   Occupational History  . Not on file.   Social History Main Topics  . Smoking status: Never Smoker  . Smokeless tobacco: Never Used  . Alcohol use No  . Drug use: No  . Sexual activity: Yes    Birth control/ protection: None   Other Topics Concern  . Not on file   Social History Narrative  . No narrative on file   No current facility-administered medications on file prior to encounter.    No current outpatient prescriptions on file prior to encounter.   No Known Allergies  I have reviewed  patient's Past Medical Hx, Surgical Hx, Family Hx, Social Hx, medications and allergies.   ROS:  Review of Systems  Constitutional: Negative for fever.  Gastrointestinal: Positive for abdominal pain. Negative for constipation, diarrhea, nausea and vomiting.  Musculoskeletal: Negative for myalgias.  Neurological: Negative for headaches.   Review of Systems  Other systems negative   Physical Exam  Physical Exam Patient Vitals for the past 24 hrs:  BP Temp Pulse Resp Height Weight  12/02/16 1541 110/65 98.2 F (36.8 C) 75 18 5\' 4"  (1.626 m) 141 lb (64 kg)   Constitutional: Well-developed, well-nourished female in no acute distress.  Cardiovascular: normal rate Respiratory: normal effort GI: Abd soft, non-tender. Pos BS x 4 MS: Extremities nontender, no edema, normal ROM Neurologic: Alert and oriented x 4.  GU: Neg CVAT.  PELVIC EXAM: Cervix pink, visually closed, without lesion, scant white creamy discharge, vaginal walls and external genitalia normal Bimanual exam: Cervix 0/long/high, firm, anterior, neg CMT, uterus nontender, nonenlarged, adnexa without tenderness, enlargement, or mass   LAB RESULTS Results for orders placed or performed during the hospital encounter of 12/02/16 (from the past 72 hour(s))  Urinalysis, Routine w reflex microscopic     Status: Abnormal   Collection Time: 12/02/16  3:35 PM  Result Value Ref Range   Color, Urine YELLOW YELLOW   APPearance CLEAR CLEAR   Specific Gravity, Urine 1.010 1.005 - 1.030   pH 7.0 5.0 - 8.0   Glucose,  UA NEGATIVE NEGATIVE mg/dL   Hgb urine dipstick LARGE (A) NEGATIVE   Bilirubin Urine NEGATIVE NEGATIVE   Ketones, ur NEGATIVE NEGATIVE mg/dL   Protein, ur NEGATIVE NEGATIVE mg/dL   Nitrite NEGATIVE NEGATIVE   Leukocytes, UA NEGATIVE NEGATIVE   RBC / HPF 6-30 0 - 5 RBC/hpf   WBC, UA 0-5 0 - 5 WBC/hpf   Bacteria, UA RARE (A) NONE SEEN   Squamous Epithelial / LPF 0-5 (A) NONE SEEN   Mucous PRESENT   Pregnancy, urine  POC     Status: Abnormal   Collection Time: 12/02/16  4:17 PM  Result Value Ref Range   Preg Test, Ur POSITIVE (A) NEGATIVE    Comment:        THE SENSITIVITY OF THIS METHODOLOGY IS >24 mIU/mL   HIV antibody (routine testing) (NOT for [facility])     Status: None   Collection Time: 12/02/16  4:19 PM  Result Value Ref Range   HIV Screen 4th Generation wRfx Non Reactive Non Reactive    Comment: (NOTE) Performed At: [facility] [address] [place], [place] [id number] Mila HomerHancock William F MD WU:[phone]   CBC     Status: None   Collection Time: 12/02/16  4:19 PM  Result Value Ref Range   WBC 6.3 4.0 - 10.5 K/uL   RBC 4.35 3.87 - 5.11 MIL/uL   Hemoglobin 13.3 12.0 - 15.0 g/dL   HCT 95.638.6 21.336.0 - 08.646.0 %   MCV 88.7 78.0 - 100.0 fL   MCH 30.6 26.0 - 34.0 pg   MCHC 34.5 30.0 - 36.0 g/dL   RDW 57.812.7 46.911.5 - 62.915.5 %   Platelets 239 150 - 400 K/uL  hCG, quantitative, pregnancy     Status: Abnormal   Collection Time: 12/02/16  4:19 PM  Result Value Ref Range   hCG, Beta Chain, Quant, S 15,810 (H) <5 mIU/mL    Comment:          GEST. AGE      CONC.  (mIU/mL)   <=1 WEEK        5 - 50     2 WEEKS       50 - 500     3 WEEKS       100 - 10,000     4 WEEKS     1,000 - 30,000     5 WEEKS     3,500 - 115,000   6-8 WEEKS     12,000 - 270,000    12 WEEKS     15,000 - 220,000        FEMALE AND NON-PREGNANT FEMALE:     LESS THAN 5 mIU/mL   Wet prep, genital     Status: Abnormal   Collection Time: 12/02/16  4:40 PM  Result Value Ref Range   Yeast Wet Prep HPF POC PRESENT (A) NONE SEEN   Trich, Wet Prep NONE SEEN NONE SEEN   Clue Cells Wet Prep HPF POC NONE SEEN NONE SEEN   WBC, Wet Prep HPF POC MANY (A) NONE SEEN   Sperm NONE SEEN        IMAGING [place] Ob Comp Less 14 Wks  Result Date: 12/02/2016 CLINICAL DATA:  Pelvic pain affecting pregnancy in the first trimester EXAM: OBSTETRIC <14 WK US AND TRANSVAGINAL OB US TECHNIQUE: Both transabdominal and transvaginal ultrasound examinations  were performed for complete evaluation of the gestation as well as the maternal uterus, adnexal regions, and pelvic cul-de-sac. Transvaginal technique was performed  to assess early pregnancy. COMPARISON:  None for this pregnancy. FINDINGS: Intrauterine gestational sac: Single Yolk sac:  Visualize Embryo:  Visualize Cardiac Activity: Visualized Heart Rate: 132  bpm CRL:  9.8  mm   7 w   9 d                  [place] EDC: 07/21/2017 Subchorionic hemorrhage: Small posterior subchorionic focus of hemorrhage. Maternal uterus/adnexae: Corpus luteum on the left. Normal right ovary. IMPRESSION: 7 week 9 day single live intrauterine gestational with adjacent small posterior subarachnoid focus of hemorrhage. Ultrasound [facility] 07/21/2017. Electronically Signed   By: Tollie Eth M.D.   On: 12/02/2016 17:58   US Ob Transvaginal  Result Date: 12/02/2016 CLINICAL DATA:  Pelvic pain affecting pregnancy in the first trimester EXAM: OBSTETRIC <14 WK [place] AND TRANSVAGINAL OB US TECHNIQUE: Both transabdominal and transvaginal ultrasound examinations were performed for complete evaluation of the gestation as well as the maternal uterus, adnexal regions, and pelvic cul-de-sac. Transvaginal technique was performed to assess early pregnancy. COMPARISON:  None for this pregnancy. FINDINGS: Intrauterine gestational sac: Single Yolk sac:  Visualize Embryo:  Visualize Cardiac Activity: Visualized Heart Rate: 132  bpm CRL:  9.8  mm   7 w   9 d                  [place] EDC: 07/21/2017 Subchorionic hemorrhage: Small posterior subchorionic focus of hemorrhage. Maternal uterus/adnexae: Corpus luteum on the left. Normal right ovary. IMPRESSION: 7 week 9 day single live intrauterine gestational with adjacent small posterior subarachnoid focus of hemorrhage. Ultrasound [facility] 07/21/2017. Electronically Signed   By: Tollie Eth M.D.   On: 12/02/2016 17:58     MAU Management/MDM: Ordered usual first trimester r/o ectopic labs.   Pelvic exam and cultures  done Will check baseline Ultrasound to rule out ectopic.  This bleeding/pain can represent a normal pregnancy with bleeding, spontaneous abortion or even an ectopic which can be life-threatening.  The process as listed above helps to determine which of these is present.  Has a single live IUP at [redacted]w[redacted]d  With small [facility].   ASSESSMENT Single intrauterine pregnancy at [redacted]w[redacted]d Cramping with early pregnancy Small subchorionic hemorrhage  PLAN Discharge home Encouraged to seek prenatal care  Pt stable at time of discharge. Encouraged to return here or to other Urgent Care/ED if she develops worsening of symptoms, increase in pain, fever, or other concerning symptoms.    Wynelle Bourgeois CNM, MSN Certified Nurse-Midwife 12/02/2016  4:34 PM

## 2016-12-03 LAB — HIV ANTIBODY (ROUTINE TESTING W REFLEX): HIV Screen 4th Generation wRfx: NONREACTIVE

## 2016-12-05 LAB — GC/CHLAMYDIA PROBE AMP (~~LOC~~) NOT AT ARMC
Chlamydia: NEGATIVE
Neisseria Gonorrhea: NEGATIVE

## 2016-12-27 ENCOUNTER — Encounter (HOSPITAL_COMMUNITY): Payer: Self-pay | Admitting: *Deleted

## 2016-12-27 ENCOUNTER — Inpatient Hospital Stay (HOSPITAL_COMMUNITY): Payer: Medicaid Other

## 2016-12-27 ENCOUNTER — Inpatient Hospital Stay (HOSPITAL_COMMUNITY)
Admission: AD | Admit: 2016-12-27 | Discharge: 2016-12-27 | Disposition: A | Discharge status: Home / Self Care | Payer: Medicaid Other | Source: Ambulatory Visit | Attending: Obstetrics & Gynecology | Admitting: Obstetrics & Gynecology

## 2016-12-27 DIAGNOSIS — R1030 Lower abdominal pain, unspecified: Secondary | ICD-10-CM | POA: Diagnosis not present

## 2016-12-27 DIAGNOSIS — Z3A10 10 weeks gestation of pregnancy: Secondary | ICD-10-CM | POA: Insufficient documentation

## 2016-12-27 DIAGNOSIS — R102 Pelvic and perineal pain: Secondary | ICD-10-CM | POA: Diagnosis present

## 2016-12-27 DIAGNOSIS — O021 Missed abortion: Secondary | ICD-10-CM

## 2016-12-27 DIAGNOSIS — O26891 Other specified pregnancy related conditions, first trimester: Secondary | ICD-10-CM | POA: Diagnosis not present

## 2016-12-27 DIAGNOSIS — O209 Hemorrhage in early pregnancy, unspecified: Secondary | ICD-10-CM | POA: Diagnosis not present

## 2016-12-27 DIAGNOSIS — R109 Unspecified abdominal pain: Secondary | ICD-10-CM

## 2016-12-27 LAB — URINALYSIS, ROUTINE W REFLEX MICROSCOPIC
Bilirubin Urine: NEGATIVE
Glucose, UA: NEGATIVE mg/dL
KETONES UR: NEGATIVE mg/dL
Leukocytes, UA: NEGATIVE
Nitrite: NEGATIVE
PROTEIN: NEGATIVE mg/dL
Specific Gravity, Urine: 1.017 (ref 1.005–1.030)
pH: 6 (ref 5.0–8.0)

## 2016-12-27 NOTE — MAU Note (Signed)
+  lower abdominal cramping this am menstrual cramps  +lower back ache  Rating abdominal pain and back pain a 7/10 Has not taken anything for the pain  +vaginal bleeding x 1 today No bleeding now Red in color String like clots

## 2016-12-27 NOTE — MAU Provider Note (Signed)
Chief Complaint: No chief complaint on file.   First Provider Initiated Contact with Patient 12/27/16 1737      SUBJECTIVE HPI: Ima Hafner is a 26 y.o. [id number] at [redacted]w[redacted]d by LMP who presents to maternity admissions reporting abdominal cramping and light bleeding with onset this morning. She reports the pain was like menstrual cramps in her low abdomen that are intermittent and do not radiate.  The pain resolved before coming to MAU. She had bleeding earlier today also that was light red and stringy, then resolved.  She denies pain or bleeding currently.  She did not try any treatments.  There are no other associated symptoms. She denies vaginal itching/burning, urinary symptoms, h/a, dizziness, n/v, or fever/chills.     HPI  History reviewed. No pertinent past medical history. Past Surgical History:  Procedure Laterality Date  . NO PAST SURGERIES    . termination of pregnancy     Social History   Social History  . Marital status: Single    Spouse name: N/A  . Number of children: N/A  . Years of education: N/A   Occupational History  . Not on file.   Social History Main Topics  . Smoking status: Never Smoker  . Smokeless tobacco: Never Used  . Alcohol use No  . Drug use: No  . Sexual activity: Yes    Birth control/ protection: None   Other Topics Concern  . Not on file   Social History Narrative  . No narrative on file   No current facility-administered medications on file prior to encounter.    Current Outpatient Prescriptions on File Prior to Encounter  Medication Sig Dispense Refill  . Prenatal Vit-Fe Fumarate-FA (PRENATAL MULTIVITAMIN) TABS tablet Take 1 tablet by mouth daily at 12 noon.     No Known Allergies  ROS:  Review of Systems  Constitutional: Negative for chills, fatigue and fever.  Respiratory: Negative for shortness of breath.   Cardiovascular: Negative for chest pain.  Gastrointestinal: Negative for nausea and vomiting.  Genitourinary:  Positive for pelvic pain and vaginal bleeding. Negative for difficulty urinating, dysuria, flank pain, vaginal discharge and vaginal pain.  Neurological: Negative for dizziness and headaches.  Psychiatric/Behavioral: Negative.      I have reviewed patient's Past Medical Hx, Surgical Hx, Family Hx, Social Hx, medications and allergies.   Physical Exam   Patient Vitals for the past 24 hrs:  BP Temp Temp src Pulse Resp SpO2 Weight  12/27/16 1657 112/63 97.5 F (36.4 C) Oral 76 16 100 % 140 lb (63.5 kg)   Constitutional: Well-developed, well-nourished female in no acute distress.  Cardiovascular: normal rate Respiratory: normal effort GI: Abd soft, non-tender. Pos BS x 4 MS: Extremities nontender, no edema, normal ROM Neurologic: Alert and oriented x 4.  GU: Neg CVAT.  PELVIC EXAM: Deferred   LAB RESULTS Results for orders placed or performed during the hospital encounter of 12/27/16 (from the past 24 hour(s))  Urinalysis, Routine w reflex microscopic     Status: Abnormal   Collection Time: 12/27/16  4:51 PM  Result Value Ref Range   Color, Urine YELLOW YELLOW   APPearance HAZY (A) CLEAR   Specific Gravity, Urine 1.017 1.005 - 1.030   pH 6.0 5.0 - 8.0   Glucose, UA NEGATIVE NEGATIVE mg/dL   Hgb urine dipstick LARGE (A) NEGATIVE   Bilirubin Urine NEGATIVE NEGATIVE   Ketones, ur NEGATIVE NEGATIVE mg/dL   Protein, ur NEGATIVE NEGATIVE mg/dL   Nitrite NEGATIVE NEGATIVE   Leukocytes,  UA NEGATIVE NEGATIVE   RBC / HPF TOO NUMEROUS TO COUNT 0 - 5 RBC/hpf   WBC, UA 0-5 0 - 5 WBC/hpf   Bacteria, UA RARE (A) NONE SEEN   Squamous Epithelial / LPF 0-5 (A) NONE SEEN   Mucous PRESENT      Blood Type: O positive  IMAGING [place] Ob Comp Less 14 Wks  Result Date: 12/27/2016 CLINICAL DATA:  Abdominal pain, follow-up study EXAM: OBSTETRIC <14 WK ULTRASOUND TECHNIQUE: Transabdominal ultrasound was performed for evaluation of the gestation as well as the maternal uterus and adnexal regions.  COMPARISON:  12/02/2016 FINDINGS: Intrauterine gestational sac: Single intrauterine gestation Yolk sac:  Not visualized Embryo:  Visualized Cardiac Activity: Not visualized CRL:   16.4  mm 8 w 0 d Subchorionic hemorrhage:  None visualized. Maternal uterus/adnexae: Bilateral ovaries are within normal limits. The right ovary measures 2.1 by 2.9 x 1.8 cm. The left ovary measures 1.6 x 2.9 x 1.7 cm. IMPRESSION: Single intrauterine gestation with crown-rump length of 16.4 mm and no cardiac activity visualized. Findings meet definitive criteria for failed pregnancy. This follows SRU consensus guidelines: Diagnostic Criteria for Nonviable Pregnancy Early in the First Trimester. Macy Mis Engl J Med [phone];369:1443-51. Electronically Signed   By: Jasmine PangKim  Fujinaga M.D.   On: 12/27/2016 19:24   [place] Ob Comp Less 14 Wks  Addendum Date: 12/15/2016   ADDENDUM REPORT: 12/15/2016 17:06 ADDENDUM: In the impression, it should state "7 week 9 days single live intrauterine gestation with adjacent small posterior subchorionic focus of hemorrhage. [facility]." Electronically Signed   By: Tollie Ethavid  Kwon M.D.   On: 12/15/2016 17:06   Result Date: 12/15/2016 CLINICAL DATA:  Pelvic pain affecting pregnancy in the first trimester EXAM: OBSTETRIC <14 WK US AND TRANSVAGINAL OB US TECHNIQUE: Both transabdominal and transvaginal ultrasound examinations were performed for complete evaluation of the gestation as well as the maternal uterus, adnexal regions, and pelvic cul-de-sac. Transvaginal technique was performed to assess early pregnancy. COMPARISON:  None for this pregnancy. FINDINGS: Intrauterine gestational sac: Single Yolk sac:  Visualize Embryo:  Visualize Cardiac Activity: Visualized Heart Rate: 132  bpm CRL:  9.8  mm   7 w   9 d                  US EDC: 07/21/2017 Subchorionic hemorrhage: Small posterior subchorionic focus of hemorrhage. Maternal uterus/adnexae: Corpus luteum on the left. Normal right ovary. IMPRESSION: 7 week 9 day  single live intrauterine gestational with adjacent small posterior subarachnoid focus of hemorrhage. [facility]. Electronically Signed: By: Tollie Ethavid  Kwon M.D. On: 12/02/2016 17:58   [place] Ob Transvaginal  Addendum Date: 12/15/2016   ADDENDUM REPORT: 12/15/2016 17:06 ADDENDUM: In the impression, it should state "7 week 9 days single live intrauterine gestation with adjacent small posterior subchorionic focus of hemorrhage. [facility]." Electronically Signed   By: Tollie Ethavid  Kwon M.D.   On: 12/15/2016 17:06   Result Date: 12/15/2016 CLINICAL DATA:  Pelvic pain affecting pregnancy in the first trimester EXAM: OBSTETRIC <14 WK US AND TRANSVAGINAL OB US TECHNIQUE: Both transabdominal and transvaginal ultrasound examinations were performed for complete evaluation of the gestation as well as the maternal uterus, adnexal regions, and pelvic cul-de-sac. Transvaginal technique was performed to assess early pregnancy. COMPARISON:  None for this pregnancy. FINDINGS: Intrauterine gestational sac: Single Yolk sac:  Visualize Embryo:  Visualize Cardiac Activity: Visualized Heart Rate: 132  bpm CRL:  9.8  mm   7 w   9 d  [place] EDC: 07/21/2017 Subchorionic hemorrhage: Small posterior subchorionic focus of hemorrhage. Maternal uterus/adnexae: Corpus luteum on the left. Normal right ovary. IMPRESSION: 7 week 9 day single live intrauterine gestational with adjacent small posterior subarachnoid focus of hemorrhage. Ultrasound [facility] 07/21/2017. Electronically Signed: By: Tollie Eth M.D. On: 12/02/2016 17:58    MAU Management/MDM: Ordered labs and [place] and reviewed results.  [place] results consistent with missed ab.  Discussed results with pt.  Pt tearful and upset. Discussed expectant management vs Cytotec vs D&C with pt and reviewed risks/benefits of each.  Pt would like her s/o to return to the room before making a decision.    Report to Dr Genevie Ann at 8:20 pm   Sharen Counter Certified  Nurse-Midwife 12/27/2016  8:17 PM   Patient decided on expectant management at this time. She was discharged home and a note was sent to the office for follow up in 2 wks.  Ernestina Penna, MD 12/27/16 8:38 PM

## 2017-01-23 ENCOUNTER — Ambulatory Visit (INDEPENDENT_AMBULATORY_CARE_PROVIDER_SITE_OTHER): Payer: Self-pay | Admitting: Family Medicine

## 2017-01-23 ENCOUNTER — Encounter: Payer: Self-pay | Admitting: Family Medicine

## 2017-01-23 VITALS — BP 106/70 | HR 82 | Ht 64.0 in | Wt 133.9 lb

## 2017-01-23 DIAGNOSIS — Z3009 Encounter for other general counseling and advice on contraception: Secondary | ICD-10-CM

## 2017-01-23 DIAGNOSIS — O021 Missed abortion: Secondary | ICD-10-CM

## 2017-01-23 NOTE — Progress Notes (Signed)
   CLINIC ENCOUNTER NOTE  History:  26 y.o. [name] here today for follow up with expectant management of missed AB.  6/13, day after MAU visit, started having bleeding and blood clots. Does not think she saw any tissue passed. Had one more day of heavier bleeding then lightened up and has stopped. Cramping has stopped as well. Denies F/C. Still has some nausea and breast tenderness.  Patient is refusing any birth control, as she feels it is "unnatural" and "not good for the body". Educated patient on safety of birth control, and all the different methods, including the non-hormonal copper IUD, but patient still refuses.   Past Surgical History:  Procedure Laterality Date  . NO PAST SURGERIES    . termination of pregnancy      The following portions of the patient's history were reviewed and updated as appropriate: allergies, current medications, past family history, past medical history, past social history, past surgical history and problem list.     Review of Systems:  See above; comprehensive review of systems was otherwise negative.   Objective:  Physical Exam BP 106/70   Pulse 82   Ht 5\' 4"  (1.626 m)   Wt 133 lb 14.4 oz (60.7 kg)   LMP 09/26/2016 (Approximate)   BMI 22.98 kg/m  CONSTITUTIONAL: Well-developed, well-nourished female in no acute distress.  HENT:  Normocephalic, atraumatic SKIN: Skin is warm and dry.  NEUROLGIC: Alert  PSYCHIATRIC: Normal mood and affect.  CARDIOVASCULAR: Normal heart rate noted RESPIRATORY: Effort and breath sounds normal, no problems with respiration noted ABDOMEN: Soft, no distention noted.  No tenderness, rebound or guarding.  PELVIC: Normal appearing external genitalia; normal appearing vaginal mucosa and cervix. No bleeding.  Normal appearing discharge.  Normal uterine size, no other palpable masses, no uterine or adnexal tenderness.   Labs and Imaging [place] Ob Comp Less 14 Wks  Result Date: 12/27/2016 CLINICAL DATA:  Abdominal  pain, follow-up study EXAM: OBSTETRIC <14 WK ULTRASOUND TECHNIQUE: Transabdominal ultrasound was performed for evaluation of the gestation as well as the maternal uterus and adnexal regions. COMPARISON:  12/02/2016 FINDINGS: Intrauterine gestational sac: Single intrauterine gestation Yolk sac:  Not visualized Embryo:  Visualized Cardiac Activity: Not visualized CRL:   16.4  mm 8 w 0 d Subchorionic hemorrhage:  None visualized. Maternal uterus/adnexae: Bilateral ovaries are within normal limits. The right ovary measures 2.1 by 2.9 x 1.8 cm. The left ovary measures 1.6 x 2.9 x 1.7 cm. IMPRESSION: Single intrauterine gestation with crown-rump length of 16.4 mm and no cardiac activity visualized. Findings meet definitive criteria for failed pregnancy. This follows SRU consensus guidelines: Diagnostic Criteria for Nonviable Pregnancy Early in the First Trimester. Macy Mis Engl J Med [id number];369:1443-51. Electronically Signed   By: Jasmine PangKim  Fujinaga M.D.   On: 12/27/2016 19:24    Assessment & Plan:   1. Missed abortion - May have passed tissue on own 6/14, will repeat BHCG, if inappropriately elevated (>3 weeks ago bleeding episode after diagnosed Missed AB), will perform TVUS and patient would elect to have Cytotec for medical management.  - Beta HCG, Quant  2. Contraceptive education - Refusing all forms of contraception. Educated patient on all forms including emergency contraception. She does not desire to get pregnant. Encouraged follow up with health department for birth control due to self pay, if she changes her mind.    Routine preventative health maintenance measures emphasized.     Jen MowElizabeth Daziya Redmond, DO OB/GYN Fellow [facility], [facility]

## 2017-01-23 NOTE — Patient Instructions (Signed)

## 2017-01-24 LAB — BETA HCG QUANT (REF LAB): hCG Quant: 2 m[IU]/mL

## 2017-01-30 ENCOUNTER — Encounter: Payer: Self-pay | Admitting: General Practice

## 2017-01-30 ENCOUNTER — Telehealth: Payer: Self-pay | Admitting: General Practice

## 2017-01-30 NOTE — Telephone Encounter (Signed)
Called patient, no answer- left message stating we are trying to reach you with non urgent results. Please call back. Will send letter.

## 2017-01-30 NOTE — Telephone Encounter (Signed)
-----   Message from [facility] Woodland Mumaw, DO sent at 01/27/2017  9:45 PM EDT ----- Please inform patient that her pregnancy hormone levels are normal, and that she does not need further treatment, she has passed all the tissue from her miscarriage. Ask patient if she would like to return or has decided if she would like birth control. Thank you.  E. Mumaw, DO

## 2017-02-20 ENCOUNTER — Inpatient Hospital Stay (HOSPITAL_COMMUNITY)
Admission: AD | Admit: 2017-02-20 | Discharge: 2017-02-20 | Disposition: A | Discharge status: Home / Self Care | Payer: Medicaid Other | Source: Ambulatory Visit | Attending: Obstetrics & Gynecology | Admitting: Obstetrics & Gynecology

## 2017-02-20 ENCOUNTER — Encounter (HOSPITAL_COMMUNITY): Payer: Self-pay | Admitting: *Deleted

## 2017-02-20 DIAGNOSIS — N898 Other specified noninflammatory disorders of vagina: Secondary | ICD-10-CM | POA: Diagnosis present

## 2017-02-20 DIAGNOSIS — Z3202 Encounter for pregnancy test, result negative: Secondary | ICD-10-CM | POA: Insufficient documentation

## 2017-02-20 LAB — URINALYSIS, ROUTINE W REFLEX MICROSCOPIC
Bilirubin Urine: NEGATIVE
Glucose, UA: NEGATIVE mg/dL
KETONES UR: NEGATIVE mg/dL
Leukocytes, UA: NEGATIVE
Nitrite: NEGATIVE
PH: 6 (ref 5.0–8.0)
PROTEIN: NEGATIVE mg/dL
Specific Gravity, Urine: 1.014 (ref 1.005–1.030)

## 2017-02-20 LAB — WET PREP, GENITAL
Clue Cells Wet Prep HPF POC: NONE SEEN
Sperm: NONE SEEN
TRICH WET PREP: NONE SEEN
YEAST WET PREP: NONE SEEN

## 2017-02-20 LAB — POCT PREGNANCY, URINE: PREG TEST UR: NEGATIVE

## 2017-02-20 NOTE — MAU Provider Note (Signed)
History    CSN: [id number]  Arrival date and time: 02/20/17 1845   First Provider Initiated Contact with Patient 02/20/17 2111     Chief Complaint  Patient presents with  . Abdominal Pain  . vaginal odor   HPI Julia Costa is a 26 y.o. [phone] non pregnant female who presents with vaginal discharge. She states it started a week ago and is grey with a foul odor. She states this happens when she gets pregnant. Patient states she has intermittent abdominal pain at home but none now. LMP 7/17. Patient had a miscarriage last month and is concerned she might be pregnant again.   OB History    Gravida Para Term Preterm AB Living   6 3 3   2 3    SAB TAB Ectopic Multiple Live Births     1   0 3      History reviewed. No pertinent past medical history.  Past Surgical History:  Procedure Laterality Date  . NO PAST SURGERIES    . termination of pregnancy      History reviewed. No pertinent family history.  Social History  Substance Use Topics  . Smoking status: Never Smoker  . Smokeless tobacco: Never Used  . Alcohol use No    Allergies: No Known Allergies  Prescriptions Prior to Admission  Medication Sig Dispense Refill Last Dose  . Prenatal Vit-Fe Fumarate-FA (PRENATAL MULTIVITAMIN) TABS tablet Take 1 tablet by mouth daily at 12 noon.   Unknown at Unknown time    Review of Systems Physical Exam   Blood pressure 122/77, pulse 92, temperature 98 F (36.7 C), temperature source Oral, resp. rate 18, height 5\' 4"  (1.626 m), weight 130 lb (59 kg), last menstrual period 01/31/2017, unknown if currently breastfeeding.  Physical Exam  MAU Course  Procedures Results for orders placed or performed during the hospital encounter of 02/20/17 (from the past 24 hour(s))  Urinalysis, Routine w reflex microscopic     Status: Abnormal   Collection Time: 02/20/17  7:13 PM  Result Value Ref Range   Color, Urine YELLOW YELLOW   APPearance CLEAR CLEAR   Specific Gravity, Urine 1.014  1.005 - 1.030   pH 6.0 5.0 - 8.0   Glucose, UA NEGATIVE NEGATIVE mg/dL   Hgb urine dipstick LARGE (A) NEGATIVE   Bilirubin Urine NEGATIVE NEGATIVE   Ketones, ur NEGATIVE NEGATIVE mg/dL   Protein, ur NEGATIVE NEGATIVE mg/dL   Nitrite NEGATIVE NEGATIVE   Leukocytes, UA NEGATIVE NEGATIVE   RBC / HPF 6-30 0 - 5 RBC/hpf   WBC, UA 0-5 0 - 5 WBC/hpf   Bacteria, UA RARE (A) NONE SEEN   Squamous Epithelial / LPF 0-5 (A) NONE SEEN   Mucous PRESENT   Pregnancy, urine POC     Status: None   Collection Time: 02/20/17  7:27 PM  Result Value Ref Range   Preg Test, Ur NEGATIVE NEGATIVE  Wet prep, genital     Status: Abnormal   Collection Time: 02/20/17  9:13 PM  Result Value Ref Range   Yeast Wet Prep HPF POC NONE SEEN NONE SEEN   Trich, Wet Prep NONE SEEN NONE SEEN   Clue Cells Wet Prep HPF POC NONE SEEN NONE SEEN   WBC, Wet Prep HPF POC FEW (A) NONE SEEN   Sperm NONE SEEN    MDM UA, UPT Wet prep and gc/chlamydia Assessment and Plan   1. Vaginal discharge   2. Vaginal odor    -Discharge patient home in stable  condition -Encouraged patient to establish care with gyn of choice -Continue prenatal vitamins, trying to conceive  -Encouraged to return here or to other Urgent Care/ED if she develops worsening of symptoms, increase in pain, fever, or other concerning symptoms.   Cleone Slim SNM 02/20/2017, 9:43 PM   I confirm that I have verified the information documented in the SNM's note and that I have also personally reperformed the physical exam and all medical decision making activities.  Discussed results Pt states she is sure it is BV Concerned about odor, states she will douche Discussed we don't recommend douching and reasons why Patient seems annoyed at explanations Follow up as needed  Aviva Signs, CNM

## 2017-02-20 NOTE — Discharge Instructions (Signed)
[facility]  ° ° °[facility]       Phone: [phone] ° °[facility] Phone: [phone] ° °[facility] [place]  Phone: [phone] ° °[facility] [place]  Phone: [phone] ° °[facility] [place]  Phone: [phone] ° °[facility]       Phone: [phone] ° °[facility]    Phone: [phone]  ° °[facility] ([place]    Phone: [phone] ° °[facility]    Phone: [phone] ° °[facility]    Phone: [phone] ° °[facility]    Phone: [phone] ° °[facility]       Phone: [phone]  ° °[facility]  Phone: [phone] ° °[facility]    Phone: [phone] ° °[facility]   Phone: [phone] ° °[facility]      Phone: [phone] ° °[facility]    Phone: [phone] ° °

## 2017-02-20 NOTE — MAU Note (Signed)
Pt C/O lower abd cramping since last week, noticed odor with vaginal discharge yesterday.  Hx of BV before.  Denies bleeding.

## 2017-02-21 LAB — GC/CHLAMYDIA PROBE AMP (~~LOC~~) NOT AT ARMC
CHLAMYDIA, DNA PROBE: NEGATIVE
Neisseria Gonorrhea: NEGATIVE

## 2017-04-17 LAB — OB RESULTS CONSOLE ABO/RH: RH TYPE: POSITIVE

## 2017-04-17 LAB — OB RESULTS CONSOLE HEPATITIS B SURFACE ANTIGEN: HEP B S AG: NEGATIVE

## 2017-04-17 LAB — OB RESULTS CONSOLE GC/CHLAMYDIA
Chlamydia: NEGATIVE
Gonorrhea: NEGATIVE

## 2017-04-17 LAB — OB RESULTS CONSOLE HIV ANTIBODY (ROUTINE TESTING): HIV: NONREACTIVE

## 2017-04-17 LAB — OB RESULTS CONSOLE RPR: RPR: NONREACTIVE

## 2017-04-17 LAB — OB RESULTS CONSOLE RUBELLA ANTIBODY, IGM: Rubella: IMMUNE

## 2017-07-18 NOTE — L&D Delivery Note (Signed)
Delivery Note Pt became complete at 0220 and pushed well; at 2:46 AM a viable female was delivered via Vaginal, Spontaneous (Presentation: ROA ).  APGAR: 8, 9; weight: pending. Loose nuchal x 1 reduced easily prior to delivery. Infant dried and placed on pt's abd; cord clamped and cut by FOB after 1min. Hospital cord blood sample collected. Placenta status: spont , intact .  Cord: 3 vessel  Anesthesia:  Epidural Episiotomy: None Lacerations:  None Est. Blood Loss (mL): 75  Mom to postpartum.  Baby to Couplet care / Skin to Skin.  Cam HaiSHAW, Amijah Timothy CNM 11/06/2017, 3:12 AM

## 2017-09-17 ENCOUNTER — Inpatient Hospital Stay (HOSPITAL_COMMUNITY)
Admission: AD | Admit: 2017-09-17 | Discharge: 2017-09-17 | Disposition: A | Discharge status: Home / Self Care | Payer: Medicaid Other | Source: Ambulatory Visit | Attending: Obstetrics and Gynecology | Admitting: Obstetrics and Gynecology

## 2017-09-17 ENCOUNTER — Encounter (HOSPITAL_COMMUNITY): Payer: Self-pay | Admitting: *Deleted

## 2017-09-17 DIAGNOSIS — Z3A31 31 weeks gestation of pregnancy: Secondary | ICD-10-CM | POA: Diagnosis not present

## 2017-09-17 DIAGNOSIS — B3731 Acute candidiasis of vulva and vagina: Secondary | ICD-10-CM

## 2017-09-17 DIAGNOSIS — O26893 Other specified pregnancy related conditions, third trimester: Secondary | ICD-10-CM

## 2017-09-17 DIAGNOSIS — O98819 Other maternal infectious and parasitic diseases complicating pregnancy, unspecified trimester: Secondary | ICD-10-CM

## 2017-09-17 DIAGNOSIS — N898 Other specified noninflammatory disorders of vagina: Secondary | ICD-10-CM

## 2017-09-17 DIAGNOSIS — O98813 Other maternal infectious and parasitic diseases complicating pregnancy, third trimester: Secondary | ICD-10-CM | POA: Diagnosis not present

## 2017-09-17 DIAGNOSIS — O26899 Other specified pregnancy related conditions, unspecified trimester: Secondary | ICD-10-CM

## 2017-09-17 DIAGNOSIS — B373 Candidiasis of vulva and vagina: Secondary | ICD-10-CM | POA: Diagnosis not present

## 2017-09-17 DIAGNOSIS — R102 Pelvic and perineal pain: Secondary | ICD-10-CM

## 2017-09-17 LAB — URINALYSIS, ROUTINE W REFLEX MICROSCOPIC
Bilirubin Urine: NEGATIVE
Glucose, UA: NEGATIVE mg/dL
Ketones, ur: NEGATIVE mg/dL
Nitrite: NEGATIVE
PH: 7 (ref 5.0–8.0)
Protein, ur: NEGATIVE mg/dL
Specific Gravity, Urine: 1.013 (ref 1.005–1.030)

## 2017-09-17 LAB — WET PREP, GENITAL
Clue Cells Wet Prep HPF POC: NONE SEEN
SPERM: NONE SEEN
Trich, Wet Prep: NONE SEEN

## 2017-09-17 MED ORDER — TERCONAZOLE 0.4 % VA CREA: 1.0000 | TOPICAL_CREAM | Freq: Every day | VAGINAL | 0 refills | Status: DC

## 2017-09-17 NOTE — Progress Notes (Signed)
0154: Patient D/C'd home with significant other, D/C instructions,medication reviewed with both patient and her significant other. VSS, pt  denies pain and a note was given for pt to return to work 09/18/2017.  All questions were answered and pt verbalized understanding and ambulated off unit.

## 2017-09-17 NOTE — MAU Note (Signed)
Swelling in hands and feet today. Leaked some d/c and fld about 0800 Sat and another time Sat afternoon. Some pelvic pain

## 2017-09-17 NOTE — Discharge Instructions (Signed)

## 2017-09-17 NOTE — MAU Provider Note (Signed)
History     CSN: [id number]  Arrival date and time: 09/17/17 0025   First Provider Initiated Contact with Patient 09/17/17 0110      Chief Complaint  Patient presents with  . Rupture of Membranes   HPI Julia Costa is a 27 y.o. [id number] at 4821w5d who presents with vaginal discharge. She states she had 2 episodes of discharge during the day today and was concerned it was her water. She states the discharge was clear. She also reports pelvic pain. Denies any contractions or vaginal bleeding. Reports good fetal movement.  [place] [facility] and denies any problems during the pregnancy.   OB History    Gravida Para Term Preterm AB Living   7 3 3   2 3    SAB TAB Ectopic Multiple Live Births     1   0 3      No past medical history on file.  Past Surgical History:  Procedure Laterality Date  . NO PAST SURGERIES    . termination of pregnancy      No family history on file.  Social History   Tobacco Use  . Smoking status: Never Smoker  . Smokeless tobacco: Never Used  Substance Use Topics  . Alcohol use: No  . Drug use: No    Allergies: No Known Allergies  Medications Prior to Admission  Medication Sig Dispense Refill Last Dose  . Prenatal Vit-Fe Fumarate-FA (PRENATAL MULTIVITAMIN) TABS tablet Take 1 tablet by mouth daily at 12 noon.   Unknown at Unknown time    Review of Systems  Constitutional: Negative.  Negative for fatigue and fever.  HENT: Negative.   Respiratory: Negative.  Negative for shortness of breath.   Cardiovascular: Negative.  Negative for chest pain.  Gastrointestinal: Negative.  Negative for abdominal pain, constipation, diarrhea, nausea and vomiting.  Genitourinary: Positive for vaginal discharge. Negative for dysuria.  Neurological: Negative.  Negative for dizziness and headaches.   Physical Exam   Blood pressure 115/65, pulse 93, temperature 98.1 F (36.7 C), resp. rate 18, height 5\' 4"  (1.626 m), weight 162 lb (73.5 kg),  last menstrual period 01/31/2017, unknown if currently breastfeeding.  Physical Exam  Nursing note and vitals reviewed. Constitutional: She is oriented to person, place, and time. She appears well-developed and well-nourished. No distress.  HENT:  Head: Normocephalic.  Eyes: Pupils are equal, round, and reactive to light.  Cardiovascular: Normal rate, regular rhythm and normal heart sounds.  Respiratory: Effort normal and breath sounds normal. No respiratory distress.  GI: Soft. Bowel sounds are normal. She exhibits no distension. There is no tenderness.  Genitourinary: Vaginal discharge (Copious thick white adherent discharge) found.  Neurological: She is alert and oriented to person, place, and time.  Skin: Skin is warm and dry.  Psychiatric: She has a normal mood and affect. Her behavior is normal. Judgment and thought content normal.   Dilation: Closed Effacement (%): Thick Cervical Position: Posterior Exam by:: Ma Hillock. Sricharan Lacomb CNM   Fetal Tracing:  Baseline: 120 Variability: moderate Accels: 15x15 Decels: none  Toco: none  MAU Course  Procedures Results for orders placed or performed during the hospital encounter of 09/17/17 (from the past 24 hour(s))  Urinalysis, Routine w reflex microscopic     Status: Abnormal   Collection Time: 09/17/17 12:45 AM  Result Value Ref Range   Color, Urine YELLOW YELLOW   APPearance CLOUDY (A) CLEAR   Specific Gravity, Urine 1.013 1.005 - 1.030   pH 7.0 5.0 -  8.0   Glucose, UA NEGATIVE NEGATIVE mg/dL   Hgb urine dipstick MODERATE (A) NEGATIVE   Bilirubin Urine NEGATIVE NEGATIVE   Ketones, ur NEGATIVE NEGATIVE mg/dL   Protein, ur NEGATIVE NEGATIVE mg/dL   Nitrite NEGATIVE NEGATIVE   Leukocytes, UA TRACE (A) NEGATIVE   RBC / HPF 6-30 0 - 5 RBC/hpf   WBC, UA 0-5 0 - 5 WBC/hpf   Bacteria, UA MANY (A) NONE SEEN   Squamous Epithelial / LPF 0-5 (A) NONE SEEN   Mucus PRESENT   Wet prep, genital     Status: Abnormal   Collection Time: 09/17/17   1:00 AM  Result Value Ref Range   Yeast Wet Prep HPF POC PRESENT (A) NONE SEEN   Trich, Wet Prep NONE SEEN NONE SEEN   Clue Cells Wet Prep HPF POC NONE SEEN NONE SEEN   WBC, Wet Prep HPF POC MANY (A) NONE SEEN   Sperm NONE SEEN     MDM UA Wet prep and gc/chlamydia Fern- negative  Assessment and Plan   1. Candidiasis of vagina during pregnancy   2. Vaginal discharge during pregnancy in third trimester   3. Pain of round ligament during pregnancy   4. [redacted] weeks gestation of pregnancy    -Discharge home in stable condition -Rx for terazol sent to patient's pharmacy -Round ligament pain precautions discussed -Patient advised to follow-up with OB in [place] as scheduled for prenatal care -Patient may return to MAU as needed or if her condition were to change or worsen  Rolm Bookbinder CNM 09/17/2017, 1:11 AM

## 2017-09-18 LAB — CULTURE, OB URINE: CULTURE: NO GROWTH

## 2017-09-18 LAB — GC/CHLAMYDIA PROBE AMP (~~LOC~~) NOT AT ARMC
Chlamydia: NEGATIVE
Neisseria Gonorrhea: NEGATIVE

## 2017-10-17 LAB — OB RESULTS CONSOLE GBS: GBS: NEGATIVE

## 2017-11-05 ENCOUNTER — Inpatient Hospital Stay (HOSPITAL_COMMUNITY): Payer: Medicaid Other | Admitting: Anesthesiology

## 2017-11-05 ENCOUNTER — Encounter (HOSPITAL_COMMUNITY): Payer: Self-pay

## 2017-11-05 ENCOUNTER — Other Ambulatory Visit: Payer: Self-pay

## 2017-11-05 ENCOUNTER — Inpatient Hospital Stay (HOSPITAL_COMMUNITY)
Admission: AD | Admit: 2017-11-05 | Discharge: 2017-11-08 | DRG: 807 | Disposition: A | Discharge status: Home / Self Care | Payer: Medicaid Other | Source: Ambulatory Visit | Attending: Family Medicine | Admitting: Family Medicine

## 2017-11-05 DIAGNOSIS — Z3A38 38 weeks gestation of pregnancy: Secondary | ICD-10-CM | POA: Diagnosis not present

## 2017-11-05 DIAGNOSIS — O26893 Other specified pregnancy related conditions, third trimester: Secondary | ICD-10-CM | POA: Diagnosis present

## 2017-11-05 DIAGNOSIS — Z3483 Encounter for supervision of other normal pregnancy, third trimester: Secondary | ICD-10-CM | POA: Diagnosis present

## 2017-11-05 DIAGNOSIS — R109 Unspecified abdominal pain: Secondary | ICD-10-CM

## 2017-11-05 LAB — CBC
HCT: 33.8 % — ABNORMAL LOW (ref 36.0–46.0)
Hemoglobin: 11.5 g/dL — ABNORMAL LOW (ref 12.0–15.0)
MCH: 31.4 pg (ref 26.0–34.0)
MCHC: 34 g/dL (ref 30.0–36.0)
MCV: 92.3 fL (ref 78.0–100.0)
PLATELETS: 198 10*3/uL (ref 150–400)
RBC: 3.66 MIL/uL — ABNORMAL LOW (ref 3.87–5.11)
RDW: 13.5 % (ref 11.5–15.5)
WBC: 11.9 10*3/uL — ABNORMAL HIGH (ref 4.0–10.5)

## 2017-11-05 LAB — TYPE AND SCREEN
ABO/RH(D): O POS
Antibody Screen: NEGATIVE

## 2017-11-05 MED ORDER — FENTANYL CITRATE (PF) 100 MCG/2ML IJ SOLN: 50.0000 ug | INTRAMUSCULAR | Status: DC | PRN

## 2017-11-05 MED ORDER — LIDOCAINE HCL (PF) 1 % IJ SOLN
30.0000 mL | INTRAMUSCULAR | Status: DC | PRN
  Filled 2017-11-05: qty 30

## 2017-11-05 MED ORDER — OXYTOCIN 10 UNIT/ML IJ SOLN: 10.0000 [IU] | Freq: Once | INTRAMUSCULAR | Status: DC

## 2017-11-05 MED ORDER — OXYTOCIN 40 UNITS IN LACTATED RINGERS INFUSION - SIMPLE MED
1.0000 m[IU]/min | INTRAVENOUS | Status: DC
  Administered 2017-11-05: 2 m[IU]/min via INTRAVENOUS
  Filled 2017-11-05: qty 1000

## 2017-11-05 MED ORDER — SODIUM CHLORIDE 0.9% FLUSH: 3.0000 mL | Freq: Two times a day (BID) | INTRAVENOUS | Status: DC

## 2017-11-05 MED ORDER — ONDANSETRON HCL 4 MG/2ML IJ SOLN: 4.0000 mg | Freq: Four times a day (QID) | INTRAMUSCULAR | Status: DC | PRN

## 2017-11-05 MED ORDER — EPHEDRINE 5 MG/ML INJ
10.0000 mg | INTRAVENOUS | Status: DC | PRN
  Filled 2017-11-05: qty 2

## 2017-11-05 MED ORDER — SOD CITRATE-CITRIC ACID 500-334 MG/5ML PO SOLN: 30.0000 mL | ORAL | Status: DC | PRN

## 2017-11-05 MED ORDER — PHENYLEPHRINE 40 MCG/ML (10ML) SYRINGE FOR IV PUSH (FOR BLOOD PRESSURE SUPPORT)
80.0000 ug | PREFILLED_SYRINGE | INTRAVENOUS | Status: DC | PRN
  Filled 2017-11-05: qty 5

## 2017-11-05 MED ORDER — HYDROXYZINE HCL 50 MG PO TABS
50.0000 mg | ORAL_TABLET | Freq: Four times a day (QID) | ORAL | Status: DC | PRN
  Filled 2017-11-05: qty 1

## 2017-11-05 MED ORDER — FENTANYL 2.5 MCG/ML BUPIVACAINE 1/10 % EPIDURAL INFUSION (WH - ANES)
14.0000 mL/h | INTRAMUSCULAR | Status: DC | PRN
  Administered 2017-11-05: 14 mL/h via EPIDURAL
  Filled 2017-11-05: qty 100

## 2017-11-05 MED ORDER — OXYTOCIN 40 UNITS IN LACTATED RINGERS INFUSION - SIMPLE MED: 2.5000 [IU]/h | INTRAVENOUS | Status: DC

## 2017-11-05 MED ORDER — PHENYLEPHRINE 40 MCG/ML (10ML) SYRINGE FOR IV PUSH (FOR BLOOD PRESSURE SUPPORT)
80.0000 ug | PREFILLED_SYRINGE | INTRAVENOUS | Status: DC | PRN
  Filled 2017-11-05: qty 10
  Filled 2017-11-05: qty 5

## 2017-11-05 MED ORDER — LACTATED RINGERS IV SOLN
INTRAVENOUS | Status: DC
  Administered 2017-11-05: 19:00:00 via INTRAVENOUS

## 2017-11-05 MED ORDER — ACETAMINOPHEN 325 MG PO TABS: 650.0000 mg | ORAL_TABLET | ORAL | Status: DC | PRN

## 2017-11-05 MED ORDER — SODIUM CHLORIDE 0.9 % IV SOLN: 250.0000 mL | INTRAVENOUS | Status: DC | PRN

## 2017-11-05 MED ORDER — OXYCODONE-ACETAMINOPHEN 5-325 MG PO TABS: 1.0000 | ORAL_TABLET | ORAL | Status: DC | PRN

## 2017-11-05 MED ORDER — SODIUM CHLORIDE 0.9% FLUSH: 3.0000 mL | INTRAVENOUS | Status: DC | PRN

## 2017-11-05 MED ORDER — OXYCODONE-ACETAMINOPHEN 5-325 MG PO TABS: 2.0000 | ORAL_TABLET | ORAL | Status: DC | PRN

## 2017-11-05 MED ORDER — LACTATED RINGERS IV SOLN: 500.0000 mL | Freq: Once | INTRAVENOUS | Status: AC

## 2017-11-05 MED ORDER — DIPHENHYDRAMINE HCL 50 MG/ML IJ SOLN: 12.5000 mg | INTRAMUSCULAR | Status: DC | PRN

## 2017-11-05 MED ORDER — LIDOCAINE HCL (PF) 1 % IJ SOLN
INTRAMUSCULAR | Status: DC | PRN
  Administered 2017-11-05 (×2): 5 mL via EPIDURAL

## 2017-11-05 MED ORDER — OXYTOCIN BOLUS FROM INFUSION
500.0000 mL | Freq: Once | INTRAVENOUS | Status: AC
  Administered 2017-11-06: 500 mL via INTRAVENOUS

## 2017-11-05 MED ORDER — TERBUTALINE SULFATE 1 MG/ML IJ SOLN
0.2500 mg | Freq: Once | INTRAMUSCULAR | Status: DC | PRN
  Filled 2017-11-05: qty 1

## 2017-11-05 MED ORDER — LACTATED RINGERS IV SOLN
500.0000 mL | INTRAVENOUS | Status: DC | PRN
  Administered 2017-11-05: 500 mL via INTRAVENOUS

## 2017-11-05 NOTE — Progress Notes (Signed)
Patient ID: Julia FerrierLataisha Costa, female   DOB: 08-20-1990, 27 y.o.   MRN: [id number]  Ctx pretty spaced out since AROM; coping well  BP 111/78, other VSS FHR 130s, +accels, no decels, Cat 1 Ctx very irreg Cx deferred (was unchanged at 5cm)  IUP@term  Protracted labor GBS neg  When fiance returns, pt desires to have epidural placed and then Pit aug started Anticipate SVD  Cam HaiSHAW, KIMBERLY CNM 11/05/2017 9:50 PM

## 2017-11-05 NOTE — Anesthesia Procedure Notes (Signed)
Epidural Patient location during procedure: OB Start time: 11/05/2017 10:59 PM End time: 11/05/2017 11:11 PM  Staffing Anesthesiologist: Heather RobertsSinger, Charnice Zwilling, MD Performed: anesthesiologist   Preanesthetic Checklist Completed: patient identified, site marked, pre-op evaluation, timeout performed, IV checked, risks and benefits discussed and monitors and equipment checked  Epidural Patient position: sitting Prep: DuraPrep Patient monitoring: heart rate, cardiac monitor, continuous pulse ox and blood pressure Approach: midline Location: L2-L3 Injection technique: LOR saline  Needle:  Needle type: Tuohy  Needle gauge: 17 G Needle length: 9 cm Needle insertion depth: 6 cm Catheter size: 20 Guage Catheter at skin depth: 11 cm Test dose: negative and Other  Assessment Events: blood not aspirated, injection not painful, no injection resistance and negative IV test  Additional Notes Informed consent obtained prior to proceeding including risk of failure, 1% risk of PDPH, risk of minor discomfort and bruising.  Discussed rare but serious complications including epidural abscess, permanent nerve injury, epidural hematoma.  Discussed alternatives to epidural analgesia and patient desires to proceed.  Timeout performed pre-procedure verifying patient name, procedure, and platelet count.  Patient tolerated procedure well.

## 2017-11-05 NOTE — H&P (Signed)
Julia Costa is a 27 y.o. female [phone] @ 38.5 wks presenting for contractions.. OB History    Gravida  7   Para  3   Term  3   Preterm      AB  2   Living  3     SAB  1   TAB  1   Ectopic      Multiple  0   Live Births  3          No past medical history on file. Past Surgical History:  Procedure Laterality Date  . NO PAST SURGERIES    . termination of pregnancy     Family History: family history is not on file. Social History:  reports that she has never smoked. She has never used smokeless tobacco. She reports that she does not drink alcohol or use drugs.     Maternal Diabetes: No Genetic Screening: Normal Maternal Ultrasounds/Referrals: Normal Fetal Ultrasounds or other Referrals:  None Maternal Substance Abuse:  No Significant Maternal Medications:  None Significant Maternal Lab Results:  None Other Comments:  None  Review of Systems  Constitutional: Negative.   HENT: Negative.   Eyes: Negative.   Respiratory: Negative.   Cardiovascular: Negative.   Gastrointestinal: Positive for abdominal pain.  Genitourinary: Negative.   Musculoskeletal: Negative.   Skin: Negative.   Neurological: Negative.   Endo/Heme/Allergies: Negative.   Psychiatric/Behavioral: Negative.    Maternal Medical History:  Reason for admission: Contractions.   Contractions: Onset was 6-12 hours ago.   Frequency: regular.   Perceived severity is mild.    Fetal activity: Perceived fetal activity is normal.   Last perceived fetal movement was within the past hour.    Prenatal complications: no prenatal complications Prenatal Complications - Diabetes: none.    Dilation: 5 Effacement (%): 70 Station: -3 Exam by:: Julia Costa, RNC  Blood pressure 115/68, pulse 95, temperature 98.6 F (37 C), temperature source Oral, resp. rate 18, height 5\' 4"  (1.626 m), weight 173 lb 4.8 oz (78.6 kg), last menstrual period 01/31/2017, unknown if currently breastfeeding. Maternal  Exam:  Uterine Assessment: Contraction strength is moderate.  Contraction frequency is regular.   Abdomen: Patient reports no abdominal tenderness. Estimated fetal weight is 8lbs.   Fetal presentation: vertex  Introitus: Normal vulva. Normal vagina.  Ferning test: not done.  Nitrazine test: not done. Amniotic fluid character: not assessed.  Pelvis: adequate for delivery.   Cervix: Cervix evaluated by digital exam.     Fetal Exam Fetal Monitor Review: Mode: fetoscope.   Baseline rate: 130.  Variability: moderate (6-25 bpm).   Pattern: accelerations present and no decelerations.    Fetal State Assessment: Category I - tracings are normal.     Physical Exam  Constitutional: She is oriented to person, place, and time. She appears well-developed and well-nourished.  HENT:  Head: Normocephalic.  Eyes: Pupils are equal, round, and reactive to light.  Neck: Normal range of motion.  Cardiovascular: Normal rate, regular rhythm, normal heart sounds and intact distal pulses.  Respiratory: Effort normal and breath sounds normal.  GI: Soft. Bowel sounds are normal.  Genitourinary: Vagina normal and uterus normal.  Musculoskeletal: Normal range of motion.  Neurological: She is alert and oriented to person, place, and time. She has normal reflexes.  Skin: Skin is warm and dry.  Psychiatric: She has a normal mood and affect. Her behavior is normal. Thought content normal.    Prenatal labs: ABO, Rh:   Antibody:   Rubella:  RPR:    HBsAg:    HIV: Non Reactive (05/18 1619)  GBS:     Assessment/Plan: Early labor @ 38.5 gbs NEG ADMIT   Julia Costa 11/05/2017, 7:01 PM

## 2017-11-05 NOTE — Anesthesia Preprocedure Evaluation (Signed)
Anesthesia Evaluation  Patient identified by MRN, date of birth, ID band Patient awake    Reviewed: Allergy & Precautions, H&P , NPO status , Patient's Chart, lab work & pertinent test results  History of Anesthesia Complications Negative for: history of anesthetic complications  Airway Mallampati: I  TM Distance: >3 FB Neck ROM: full    Dental no notable dental hx.    Pulmonary neg pulmonary ROS,    Pulmonary exam normal        Cardiovascular negative cardio ROS Normal cardiovascular exam     Neuro/Psych negative neurological ROS     GI/Hepatic negative GI ROS, Neg liver ROS,   Endo/Other  negative endocrine ROS  Renal/GU negative Renal ROS     Musculoskeletal   Abdominal (+) + obese,   Peds  Hematology negative hematology ROS (+)   Anesthesia Other Findings   Reproductive/Obstetrics (+) Pregnancy                             Anesthesia Physical  Anesthesia Plan  ASA: II  Anesthesia Plan: Epidural   Post-op Pain Management:    Induction:   PONV Risk Score and Plan:   Airway Management Planned: Natural Airway  Additional Equipment:   Intra-op Plan:   Post-operative Plan:   Informed Consent: I have reviewed the patients History and Physical, chart, labs and discussed the procedure including the risks, benefits and alternatives for the proposed anesthesia with the patient or authorized representative who has indicated his/her understanding and acceptance.     Plan Discussed with: Anesthesiologist  Anesthesia Plan Comments:         Anesthesia Quick Evaluation

## 2017-11-05 NOTE — Progress Notes (Addendum)
G7P3 @ 38.[redacted] wksga. Here dt contractions that started last night. Denies LOF or bleeding. +FM.   EFM applied VE: 5/70/BB  1858: Provider notified. Report given. Ordered to admit.  Birthing charge nurse notified. Room assigned to 168  1910: Labs and IV started.  1920: Pt to birthing via wheelchair.

## 2017-11-06 ENCOUNTER — Encounter (HOSPITAL_COMMUNITY): Payer: Self-pay

## 2017-11-06 DIAGNOSIS — Z3A38 38 weeks gestation of pregnancy: Secondary | ICD-10-CM

## 2017-11-06 LAB — RPR: RPR: NONREACTIVE

## 2017-11-06 MED ORDER — ONDANSETRON HCL 4 MG/2ML IJ SOLN: 4.0000 mg | INTRAMUSCULAR | Status: DC | PRN

## 2017-11-06 MED ORDER — MISOPROSTOL 200 MCG PO TABS
ORAL_TABLET | ORAL | Status: AC
  Filled 2017-11-06: qty 3

## 2017-11-06 MED ORDER — DIBUCAINE 1 % RE OINT: 1.0000 "application " | TOPICAL_OINTMENT | RECTAL | Status: DC | PRN

## 2017-11-06 MED ORDER — ZOLPIDEM TARTRATE 5 MG PO TABS: 5.0000 mg | ORAL_TABLET | Freq: Every evening | ORAL | Status: DC | PRN

## 2017-11-06 MED ORDER — BENZOCAINE-MENTHOL 20-0.5 % EX AERO: 1.0000 "application " | INHALATION_SPRAY | CUTANEOUS | Status: DC | PRN

## 2017-11-06 MED ORDER — IBUPROFEN 600 MG PO TABS
600.0000 mg | ORAL_TABLET | Freq: Four times a day (QID) | ORAL | Status: DC
  Administered 2017-11-06 – 2017-11-08 (×9): 600 mg via ORAL
  Filled 2017-11-06 (×10): qty 1

## 2017-11-06 MED ORDER — ONDANSETRON HCL 4 MG PO TABS: 4.0000 mg | ORAL_TABLET | ORAL | Status: DC | PRN

## 2017-11-06 MED ORDER — DIPHENHYDRAMINE HCL 25 MG PO CAPS: 25.0000 mg | ORAL_CAPSULE | Freq: Four times a day (QID) | ORAL | Status: DC | PRN

## 2017-11-06 MED ORDER — SIMETHICONE 80 MG PO CHEW: 80.0000 mg | CHEWABLE_TABLET | ORAL | Status: DC | PRN

## 2017-11-06 MED ORDER — OXYCODONE HCL 5 MG PO TABS
5.0000 mg | ORAL_TABLET | ORAL | Status: DC | PRN
  Administered 2017-11-06 – 2017-11-08 (×3): 5 mg via ORAL
  Filled 2017-11-06 (×4): qty 1

## 2017-11-06 MED ORDER — SENNOSIDES-DOCUSATE SODIUM 8.6-50 MG PO TABS
2.0000 | ORAL_TABLET | ORAL | Status: DC
  Administered 2017-11-06 – 2017-11-08 (×2): 2 via ORAL
  Filled 2017-11-06 (×2): qty 2

## 2017-11-06 MED ORDER — WITCH HAZEL-GLYCERIN EX PADS: 1.0000 "application " | MEDICATED_PAD | CUTANEOUS | Status: DC | PRN

## 2017-11-06 MED ORDER — COCONUT OIL OIL: 1.0000 "application " | TOPICAL_OIL | Status: DC | PRN

## 2017-11-06 MED ORDER — ACETAMINOPHEN 325 MG PO TABS
650.0000 mg | ORAL_TABLET | ORAL | Status: DC | PRN
  Administered 2017-11-06 (×2): 650 mg via ORAL
  Filled 2017-11-06 (×2): qty 2

## 2017-11-06 MED ORDER — TETANUS-DIPHTH-ACELL PERTUSSIS 5-2.5-18.5 LF-MCG/0.5 IM SUSP: 0.5000 mL | Freq: Once | INTRAMUSCULAR | Status: DC

## 2017-11-06 MED ORDER — PRENATAL MULTIVITAMIN CH
1.0000 | ORAL_TABLET | Freq: Every day | ORAL | Status: DC
  Administered 2017-11-06 – 2017-11-07 (×2): 1 via ORAL
  Filled 2017-11-06 (×2): qty 1

## 2017-11-06 MED ORDER — MISOPROSTOL 200 MCG PO TABS
600.0000 ug | ORAL_TABLET | Freq: Once | ORAL | Status: AC
  Administered 2017-11-06: 600 ug via BUCCAL

## 2017-11-06 NOTE — Progress Notes (Signed)

## 2017-11-06 NOTE — Anesthesia Postprocedure Evaluation (Signed)
Anesthesia Post Note  Patient: Julia FerrierLataisha Costa  Procedure(s) Performed: AN AD HOC LABOR EPIDURAL     Patient location during evaluation: Mother Baby Anesthesia Type: Epidural Level of consciousness: awake Pain management: satisfactory to patient Vital Signs Assessment: post-procedure vital signs reviewed and stable Respiratory status: spontaneous breathing Cardiovascular status: stable Anesthetic complications: no    Last Vitals:  Vitals:   11/06/17 0445 11/06/17 0556  BP: (!) 145/85 (!) 105/55  Pulse: 85 89  Resp: 18 20  Temp: 36.8 C 37.7 C  SpO2:      Last Pain:  Vitals:   11/06/17 0556  TempSrc: Oral  PainSc: 0-No pain   Pain Goal: Patients Stated Pain Goal: 5 (11/05/17 1837)               Cephus ShellingBURGER,Jewelz Kobus

## 2017-11-07 MED ORDER — IBUPROFEN 600 MG PO TABS: 600.0000 mg | ORAL_TABLET | Freq: Four times a day (QID) | ORAL | 0 refills | Status: DC

## 2017-11-07 NOTE — Clinical Social Work Maternal (Signed)
CLINICAL SOCIAL WORK MATERNAL/CHILD NOTE  Patient Details  Name: Julia Costa MRN: [id number] Date of Birth: 29-Nov-1990  Date:  11/07/2017  Clinical Social Worker Initiating Note:  Terri Piedra, [place] Date/Time: Initiated:  11/07/17/1300     Child's Name:  Julia Costa   Biological Parents:  Mother, Father(Julia Costa and Julia Costa)   Need for Interpreter:  None   Reason for Referral:  Other (Comment)(Argument between [facility] and FOB in hospital.)   Address:  [place] [address]    Phone number:  [phone] (home)     Additional phone number:  Household Members/Support Persons (HM/SP):   Household Member/Support Person 1, Household Member/Support Person 2, Household Member/Support Person 3   HM/SP Name Relationship DOB or Age  HM/SP -[place] FOB/boyfriend    HM/SP -2   PGM    HM/SP -3   PGF    HM/SP -4        HM/SP -5        HM/SP -6        HM/SP -7        HM/SP -8          Natural Supports (not living in the home):  (MOB reports that all of her family live in [place])   Professional Supports: None(MOB is interested in counseling for herself and her boyfriend, althugh she is unsure if he is willing to go.)   Employment:     Type of Work: MOB works at the [employer]   Education:      Homebound arranged:    Museum/gallery curator Resources:  Medicaid   Other Resources:  [facility]   Cultural/Religious Considerations Which May Impact Care: None stated.  Strengths:  Ability to meet basic needs , Pediatrician chosen   Psychotropic Medications:         Pediatrician:    [place]  Pediatrician List:   [facility]      Pediatrician Fax Number:    Risk Factors/Current Problems:  Family/Relationship Issues     Cognitive State:  Able to Concentrate , Alert , Linear Thinking    Mood/Affect:  Calm ,  Tearful , Interested    CSW Assessment: CSW received call from bedside RN stating that MOB and FOB had gotten into an argument in MOB's room and security had to come and escort FOB out of the hospital.  CSW met with MOB, who was tearful and initially didn't seem as though CSW would be able to engage her in conversation, but after a little rapport building and validation of her feelings, she opened up to [name].  She was tearful and states that she feels FOB has emotional baggage from previous relationships that he "takes out on me."  She states he has been accusing her of cheating on him with his brother, which is not true.  She states she thinks he needs to see a therapist to deal with his issues.  She told CSW that the issue today centered around whether or not he was going to put his name on the birth certificate and when things got escalated, she just needed him "out of my face."  She declines wishes to end the relationship or get away from FOB.  She states no verbal, emotional, physical or sexual abuse, but does state that they argue  a lot.  She states she feels safe at home with him and his parents.  She also thinks that she is feeling overly emotionally and less able to cope with stress given that she has just had a baby.  CSW normalized and validated this as an important point that she makes.  CSW also took this as an opportunity to discuss PMADs and the importance of monitoring her emotions and mental health.   CSW inquired about her support system and three other children.  MOB reports that her family lives in [place].  She states that she and the father of her first two daughters moved here together from [place].  She states they split up and she had another child with another man.  After that break up, she got her own apartment, but lost it when she lost her job and could not longer afford the rent.  She states that she and the fathers of her other children do not have a relationship and that they will not let  her see her daughters.  She states they have never gone to court and denies CPS involvement.  CSW confirmed no CPS hx in [place]   CSW spoke to [facility] about the fact that we cannot control anyone else; only how we react to others and how we allow others to affect [place].  CSW strongly recommends MOB to consider counseling given the relationship issues as well as having three children she does not currently have contact with.  MOB states a willingness to seek counseling and was receptive to resources.  CSW recommends [facility] [place] as they can continue to treat the family with a child's Medicaid if MOB loses her pregnancy Medicaid and explained Healthy Start services, which can provide in-home counseling.  CSW also gave information for the [facility] and encouraged MOB to discuss counseling with FOB.  MOB stated appreciation for the information.  CSW encouraged her to continue to use the [place] Postnatal Depression Scale and provided her with a New Mom Checklist from Postpartum Progress as way to evaluate her mental health during the postpartum period. CSW inquired about basic supplies/preparations for baby.  She states she has most everything for baby, but needs to get more bottles.  CSW asked specifically about where baby will sleep and she replied, "in bed with me."  CSW informed MOB that she should never sleep with her baby as this is a risk for suffocation and informed her that babies tragically and unfortunately die in our own community from sleeping in bed with a caregiver.  CSW informed her on how to access a baby box from [employer] and MOB stated she would change her plan and utilize the baby box.   MOB asked CSW if CSW would return to have a conversation with both she and FOB when he returns to the hospital.  CSW agrees.  CSW asked RN to please notify CSW if any further incidents occur.  CSW also spoke with MOB about the importance of keeping baby safe and out of the middle  of any and all arguments between her and FOB.  MOB agreed.     CSW Plan/Description:  No Further Intervention Required/No Barriers to Discharge, Sudden Infant Death Syndrome (SIDS) Education, Perinatal Mood and Anxiety Disorder (PMADs) Education, Other Information/Referral to [place], [place], [place] 11/07/2017, 3:44 PM

## 2017-11-07 NOTE — Progress Notes (Signed)
POSTPARTUM PROGRESS NOTE  Post Partum Day 1  Subjective:  Lynnea FerrierLataisha Moore is a 27 y.o. [id number] 10131w6d s/p SVD after presenting with contractions.  No acute events overnight.  Pt denies problems with ambulating, voiding or po intake.  She denies nausea or vomiting.  Pain is well controlled.  She has had flatus. She has had bowel movement.  Lochia Small.   Objective: Blood pressure 127/74, pulse 83, temperature 97.9 F (36.6 C), temperature source Oral, resp. rate 18, height 5\' 4"  (1.626 m), weight 173 lb 4.8 oz (78.6 kg), last menstrual period 01/31/2017, SpO2 100 %, unknown if currently breastfeeding.  Physical Exam:  General: alert, cooperative and no distress Lochia:normal flow Chest: CTAB Heart: RRR no m/r/g Abdomen: +BS, soft, nontender,  Uterine Fundus: firm, palpable just inferior to umbilicus DVT Evaluation: No calf swelling or tenderness Extremities: no lower extremity edema  Recent Labs    11/05/17 1910  HGB 11.5*  HCT 33.8*    Assessment/Plan:  ASSESSMENT: Lynnea FerrierLataisha Moore is a 27 y.o. [id number] 5831w6d s/p SVD following spontaneous onset of labor.   Plan for discharge tomorrow and Contraception OCPs. Patient is currently formula feeding.     LOS: 2 days   Gorden HarmsMegan Clancy Mullarkey, MD PGY-2 11/07/2017, 9:55 AM

## 2017-11-07 NOTE — Discharge Instructions (Signed)
Postpartum Care After Vaginal Delivery °The period of time right after you deliver your newborn is called the postpartum period. °What kind of medical care will I receive? °· You may continue to receive fluids and medicines through an IV tube inserted into one of your veins. °· If an incision was made near your vagina (episiotomy) or if you had some vaginal tearing during delivery, cold compresses may be placed on your episiotomy or your tear. This helps to reduce pain and swelling. °· You may be given a squirt bottle to use when you go to the bathroom. You may use this until you are comfortable wiping as usual. To use the squirt bottle, follow these steps: °? Before you urinate, fill the squirt bottle with warm water. Do not use hot water. °? After you urinate, while you are sitting on the toilet, use the squirt bottle to rinse the area around your urethra and vaginal opening. This rinses away any urine and blood. °? You may do this instead of wiping. As you start healing, you may use the squirt bottle before wiping yourself. Make sure to wipe gently. °? Fill the squirt bottle with clean water every time you use the bathroom. °· You will be given sanitary pads to wear. °How can I expect to feel? °· You may not feel the need to urinate for several hours after delivery. °· You will have some soreness and pain in your abdomen and vagina. °· If you are breastfeeding, you may have uterine contractions every time you breastfeed for up to several weeks postpartum. Uterine contractions help your uterus return to its normal size. °· It is normal to have vaginal bleeding (lochia) after delivery. The amount and appearance of lochia is often similar to a menstrual period in the first week after delivery. It will gradually decrease over the next few weeks to a dry, yellow-brown discharge. For most women, lochia stops completely by 6-8 weeks after delivery. Vaginal bleeding can vary from woman to woman. °· Within the first few  days after delivery, you may have breast engorgement. This is when your breasts feel heavy, full, and uncomfortable. Your breasts may also throb and feel hard, tightly stretched, warm, and tender. After this occurs, you may have milk leaking from your breasts. Your health care provider can help you relieve discomfort due to breast engorgement. Breast engorgement should go away within a few days. °· You may feel more sad or worried than normal due to hormonal changes after delivery. These feelings should not last more than a few days. If these feelings do not go away after several days, speak with your health care provider. °How should I care for myself? °· Tell your health care provider if you have pain or discomfort. °· Drink enough water to keep your urine clear or pale yellow. °· Wash your hands thoroughly with soap and water for at least 20 seconds after changing your sanitary pads, after using the toilet, and before holding or feeding your baby. °· If you are not breastfeeding, avoid touching your breasts a lot. Doing this can make your breasts produce more milk. °· If you become weak or lightheaded, or you feel like you might faint, ask for help before: °? Getting out of bed. °? Showering. °· Change your sanitary pads frequently. Watch for any changes in your flow, such as a sudden increase in volume, a change in color, the passing of large blood clots. If you pass a blood clot from your vagina, save it   to show to your health care provider. Do not flush blood clots down the toilet without having your health care provider look at them. °· Make sure that all your vaccinations are up to date. This can help protect you and your baby from getting certain diseases. You may need to have immunizations done before you leave the hospital. °· If desired, talk with your health care provider about methods of family planning or birth control (contraception). °How can I start bonding with my baby? °Spending as much time as  possible with your baby is very important. During this time, you and your baby can get to know each other and develop a bond. Having your baby stay with you in your room (rooming in) can give you time to get to know your baby. Rooming in can also help you become comfortable caring for your baby. Breastfeeding can also help you bond with your baby. °How can I plan for returning home with my baby? °· Make sure that you have a car seat installed in your vehicle. °? Your car seat should be checked by a certified car seat installer to make sure that it is installed safely. °? Make sure that your baby fits into the car seat safely. °· Ask your health care provider any questions you have about caring for yourself or your baby. Make sure that you are able to contact your health care provider with any questions after leaving the hospital. °This information is not intended to replace advice given to you by your health care provider. Make sure you discuss any questions you have with your health care provider. °Document Released: 05/01/2007 Document Revised: 12/07/2015 Document Reviewed: 06/08/2015 °Elsevier Interactive Patient Education © 2018 Elsevier Inc. ° °

## 2017-11-07 NOTE — Progress Notes (Signed)
The FOB from room 106 approached the secretary at the desk with the infant in his arms and after repeating to him several times that the baby needs to be in the crib, the secretary ask to have the baby realizing that he was angry but he refused to hand the infant to her.  The secretary had witnessed yelling in the room over the call bell. RN went to the room and RN witnessed Julia Costa and support person arguing and he had the infant in his arm and would not put him down.  Julia Costa was trying to reach for the baby and he kept shifting him away from her.  She stated "I'm scared to go home with him and I don't want him on the birth certificate.  Security was called and the FOB was removed from the hospital.  Social work consult was ordered by and called to be informed of the situation.

## 2017-11-07 NOTE — Discharge Summary (Addendum)
OB Discharge Summary     Patient Name: Julia FerrierLataisha Costa DOB: 1990-09-10 MRN: [id number]  Date of admission: 11/05/2017 Delivering MD: Cam HaiSHAW, KIMBERLY D   Date of discharge: 11/08/2017  Admitting diagnosis: 38.5 WKS, CTXS, PAIN Intrauterine pregnancy: 4735w6d     Secondary diagnosis:  Active Problems:   Abdominal pain in pregnancy, third trimester  Additional problems: history of anxiety/depression     Discharge diagnosis: Term Pregnancy Delivered                                                                                                Post partum procedures:none  Augmentation: AROM and Pitocin  Complications: None  Hospital course:  Onset of Labor With Vaginal Delivery     27 y.o. yo [id number] at 1335w6d was admitted in Latent Labor on 11/05/2017. Patient had an uncomplicated labor course as follows:  Membrane Rupture Time/Date: 7:30 PM ,11/05/2017   Intrapartum Procedures: Episiotomy: None [1]                                         Lacerations:  None [1]  Patient had a delivery of a Viable infant. 11/06/2017  Information for the patient's newborn:  Julia Costa, Boy Romayne [[id number]][[id number]]  Delivery Method: Vag-Spont    Pateint had an uncomplicated postpartum course.  She is ambulating, tolerating a regular diet, passing flatus, and urinating well. Patient is discharged home in stable condition on 11/08/17.   Physical exam  Vitals:   11/06/17 1630 11/07/17 0511 11/07/17 1658 11/08/17 0530  BP: 111/68 127/74 124/79 103/73  Pulse: 79 83 93 88  Resp: 20 18 18 18   Temp: 98.7 F (37.1 C) 97.9 F (36.6 C) 98.5 F (36.9 C) 98.2 F (36.8 C)  TempSrc: Oral Oral Oral Oral  SpO2: 100%   98%  Weight:      Height:       General: alert, cooperative and no distress Lochia: appropriate Uterine Fundus: firm Incision: N/A DVT Evaluation: No evidence of DVT seen on physical exam. Labs: Lab Results  Component Value Date   WBC 11.9 (H) 11/05/2017   HGB 11.5 (L) 11/05/2017   HCT 33.8 (L)  11/05/2017   MCV 92.3 11/05/2017   PLT 198 11/05/2017   CMP Latest Ref Rng & Units 01/08/2015  Glucose 65 - 99 mg/dL 79  BUN 6 - 20 mg/dL 7  Creatinine 1.300.44 - 8.651.00 mg/dL 7.840.47  Sodium 696135 - 295145 mmol/L 136  Potassium 3.5 - 5.1 mmol/L 3.4(L)  Chloride 101 - 111 mmol/L 102  CO2 22 - 32 mmol/L 26  Calcium 8.9 - 10.3 mg/dL 9.0  Total Protein 6.5 - 8.1 g/dL 7.2  Total Bilirubin 0.3 - 1.2 mg/dL 0.6  Alkaline Phos 38 - 126 U/L 43  AST 15 - 41 U/L 19  ALT 14 - 54 U/L 14    Discharge instruction: per After Visit Summary and "Baby and Me Booklet".  After visit meds:  Allergies as of 11/08/2017   No Known Allergies  Medication List    STOP taking these medications   terconazole 0.4 % vaginal cream Commonly known as:  TERAZOL 7     TAKE these medications   ibuprofen 600 MG tablet Commonly known as:  ADVIL,MOTRIN Take 1 tablet (600 mg total) by mouth every 6 (six) hours.   prenatal multivitamin Tabs tablet Take 1 tablet by mouth daily at 12 noon.       Diet: routine diet  Activity: Advance as tolerated. Pelvic rest for 6 weeks.   Outpatient follow up:4 weeks Follow up Appt:No future appointments. Follow up Visit: Follow-up Information    [facility] Follow up in 4 week(s).   Specialty:  Obstetrics and Gynecology Why:  Call office to schedule follow-up appointment.  Contact information: [address], [address] [place] [address] [phone]          Postpartum contraception: Combination OCPs  Newborn Data: Live born female  Birth Weight: 7 lb 13 oz (3544 g) APGAR: 8, 9  Newborn Delivery   Birth date/time:  11/06/2017 02:46:00 Delivery type:  Vaginal, Spontaneous     Baby Feeding: Bottle Disposition:home with mother   11/08/2017 Gorden Harms, MD  OB FELLOW DISCHARGE ATTESTATION  I have seen and examined this patient. I agree with above documentation and have made edits as needed.   Caryl Ada,  DO OB Fellow 1:31 PM

## 2017-11-07 NOTE — Progress Notes (Signed)
FOB was invited back to mothers room by MOB. FOB has been present and supportive since return to moms room. No arguing witnessed by this RN.

## 2017-11-08 MED ORDER — IBUPROFEN 600 MG PO TABS: 600.0000 mg | ORAL_TABLET | Freq: Four times a day (QID) | ORAL | 0 refills | Status: DC

## 2017-12-05 ENCOUNTER — Encounter: Payer: Self-pay | Admitting: Medical

## 2017-12-05 ENCOUNTER — Ambulatory Visit (INDEPENDENT_AMBULATORY_CARE_PROVIDER_SITE_OTHER): Payer: Medicaid Other | Admitting: Medical

## 2017-12-05 DIAGNOSIS — Z3202 Encounter for pregnancy test, result negative: Secondary | ICD-10-CM

## 2017-12-05 DIAGNOSIS — Z1389 Encounter for screening for other disorder: Secondary | ICD-10-CM

## 2017-12-05 DIAGNOSIS — Z30011 Encounter for initial prescription of contraceptive pills: Secondary | ICD-10-CM

## 2017-12-05 DIAGNOSIS — Z3009 Encounter for other general counseling and advice on contraception: Secondary | ICD-10-CM | POA: Diagnosis not present

## 2017-12-05 LAB — POCT URINALYSIS DIP (DEVICE)
BILIRUBIN URINE: NEGATIVE
GLUCOSE, UA: NEGATIVE mg/dL
KETONES UR: NEGATIVE mg/dL
LEUKOCYTES UA: NEGATIVE
NITRITE: NEGATIVE
Protein, ur: 100 mg/dL — AB
Specific Gravity, Urine: 1.02 (ref 1.005–1.030)
Urobilinogen, UA: 0.2 mg/dL (ref 0.0–1.0)
pH: 6 (ref 5.0–8.0)

## 2017-12-05 LAB — POCT URINE PREGNANCY: PREG TEST UR: NEGATIVE

## 2017-12-05 MED ORDER — NORGESTIMATE-ETH ESTRADIOL 0.25-35 MG-MCG PO TABS: 1.0000 | ORAL_TABLET | Freq: Every day | ORAL | 11 refills | Status: DC

## 2017-12-05 NOTE — Progress Notes (Signed)
Post Partum Exam  Julia Costa is a 27 y.o. [phone] female who presents for a postpartum visit. She is 4 weeks postpartum following a spontaneous vaginal delivery. I have fully reviewed the prenatal and intrapartum course. The delivery was at 38.5 gestational weeks.  Anesthesia: epidural. Postpartum course has been unremarkable. Baby's course has been unremarkable. Baby is feeding by bottle - Gerber Gentle. Bleeding no bleeding. Bowel function is normal. Bladder function is normal. Patient is sexually active, last intercourse was last night. Contraception method is none. Postpartum depression screening:neg  The following portions of the patient's history were reviewed and updated as appropriate: allergies, current medications, past family history, past medical history, past social history, past surgical history and problem list. Last pap smear done 04/2017 and was Normal  Review of Systems Pertinent items are noted in HPI.    Objective:  Blood pressure 117/79, pulse 90, resp. rate 16, height  (1.626 m), weight 154 lb 11.2 oz (70.2 kg), unknown if currently breastfeeding.  General:  alert and cooperative   Breasts:  deferred  Lungs: clear to auscultation bilaterally  Heart:  regular rate and rhythm, S1, S2 normal, no murmur, click, rub or gallop  Abdomen: soft, non-tender; bowel sounds normal; no masses,  no organomegaly   Vulva:  not evaluated  Vagina: not evaluated  Cervix:  not evaluated  Corpus: not examined  Adnexa:  not evaluated  Rectal Exam: Not performed.         Assessment:    Normal postpartum exam. Pap smear not done at today's visit.  Birth control counseling  Negative pregnancy test   Plan:   1. Contraception: OCP (estrogen/progesterone) 2. Advised back up method of birth control at least until completed first pill pack 3. Advised to start OCPs after period starts 3. Follow up in: 1 year for annual exam or sooner as needed.   Marny Lowenstein, PA-C 12/05/2017  11:07 AM

## 2017-12-05 NOTE — Patient Instructions (Signed)
Oral Contraception Information Oral contraceptive pills (OCPs) are medicines taken to prevent pregnancy. OCPs work by preventing the ovaries from releasing eggs. The hormones in OCPs also cause the cervical mucus to thicken, preventing the sperm from entering the uterus. The hormones also cause the uterine lining to become thin, not allowing a fertilized egg to attach to the inside of the uterus. OCPs are highly effective when taken exactly as prescribed. However, OCPs do not prevent sexually transmitted diseases (STDs). Safe sex practices, such as using condoms along with the pill, can help prevent STDs. Before taking the pill, you may have a physical exam and Pap test. Your health care provider may order blood tests. The health care provider will make sure you are a good candidate for oral contraception. Discuss with your health care provider the possible side effects of the OCP you may be prescribed. When starting an OCP, it can take 2 to 3 months for the body to adjust to the changes in hormone levels in your body. Types of oral contraception  The combination pill-This pill contains estrogen and progestin (synthetic progesterone) hormones. The combination pill comes in 21-day, 28-day, or 91-day packs. Some types of combination pills are meant to be taken continuously (365-day pills). With 21-day packs, you do not take pills for 7 days after the last pill. With 28-day packs, the pill is taken every day. The last 7 pills are without hormones. Certain types of pills have more than 21 hormone-containing pills. With 91-day packs, the first 84 pills contain both hormones, and the last 7 pills contain no hormones or contain estrogen only.  The minipill-This pill contains the progesterone hormone only. The pill is taken every day continuously. It is very important to take the pill at the same time each day. The minipill comes in packs of 28 pills. All 28 pills contain the hormone. Advantages of oral  contraceptive pills  Decreases premenstrual symptoms.  Treats menstrual period cramps.  Regulates the menstrual cycle.  Decreases a heavy menstrual flow.  May treatacne, depending on the type of pill.  Treats abnormal uterine bleeding.  Treats polycystic ovarian syndrome.  Treats endometriosis.  Can be used as emergency contraception. Things that can make oral contraceptive pills less effective OCPs can be less effective if:  You forget to take the pill at the same time every day.  You have a stomach or intestinal disease that lessens the absorption of the pill.  You take OCPs with other medicines that make OCPs less effective, such as antibiotics, certain HIV medicines, and some seizure medicines.  You take expired OCPs.  You forget to restart the pill on day 7, when using the packs of 21 pills.  Risks associated with oral contraceptive pills Oral contraceptive pills can sometimes cause side effects, such as:  Headache.  Nausea.  Breast tenderness.  Irregular bleeding or spotting.  Combination pills are also associated with a small increased risk of:  Blood clots.  Heart attack.  Stroke.  This information is not intended to replace advice given to you by your health care provider. Make sure you discuss any questions you have with your health care provider. Document Released: 09/24/2002 Document Revised: 12/10/2015 Document Reviewed: 12/23/2012 Elsevier Interactive Patient Education  2018 Elsevier Inc.  

## 2017-12-06 ENCOUNTER — Ambulatory Visit: Payer: Self-pay | Admitting: Obstetrics & Gynecology

## 2018-06-05 ENCOUNTER — Encounter (HOSPITAL_COMMUNITY): Payer: Self-pay | Admitting: *Deleted

## 2018-06-05 ENCOUNTER — Inpatient Hospital Stay (HOSPITAL_COMMUNITY)
Admission: AD | Admit: 2018-06-05 | Discharge: 2018-06-05 | Disposition: A | Discharge status: Home / Self Care | Payer: Medicaid Other | Source: Ambulatory Visit | Attending: Obstetrics & Gynecology | Admitting: Obstetrics & Gynecology

## 2018-06-05 DIAGNOSIS — O26891 Other specified pregnancy related conditions, first trimester: Secondary | ICD-10-CM | POA: Diagnosis not present

## 2018-06-05 DIAGNOSIS — O26892 Other specified pregnancy related conditions, second trimester: Secondary | ICD-10-CM | POA: Insufficient documentation

## 2018-06-05 DIAGNOSIS — Z3A12 12 weeks gestation of pregnancy: Secondary | ICD-10-CM | POA: Diagnosis not present

## 2018-06-05 DIAGNOSIS — N898 Other specified noninflammatory disorders of vagina: Secondary | ICD-10-CM | POA: Diagnosis not present

## 2018-06-05 DIAGNOSIS — R1030 Lower abdominal pain, unspecified: Secondary | ICD-10-CM | POA: Diagnosis present

## 2018-06-05 LAB — URINALYSIS, ROUTINE W REFLEX MICROSCOPIC
Bilirubin Urine: NEGATIVE
GLUCOSE, UA: NEGATIVE mg/dL
Ketones, ur: 20 mg/dL — AB
Leukocytes, UA: NEGATIVE
Nitrite: NEGATIVE
PH: 7 (ref 5.0–8.0)
Protein, ur: 100 mg/dL — AB
Specific Gravity, Urine: 1.019 (ref 1.005–1.030)

## 2018-06-05 LAB — WET PREP, GENITAL
Clue Cells Wet Prep HPF POC: NONE SEEN
Trich, Wet Prep: NONE SEEN
WBC, Wet Prep HPF POC: NONE SEEN
Yeast Wet Prep HPF POC: NONE SEEN

## 2018-06-05 LAB — POCT PREGNANCY, URINE: PREG TEST UR: POSITIVE — AB

## 2018-06-05 NOTE — Discharge Instructions (Signed)

## 2018-06-05 NOTE — MAU Note (Signed)
Pt C/O abdominal cramping x 2 days, having vaginal discharge with odor, denies bleeding.  Pos UPT @ [facility] in October.  Has nausea, no vomiting

## 2018-06-05 NOTE — MAU Provider Note (Signed)
Chief Complaint: Abdominal Pain and Vaginal Discharge   First Provider Initiated Contact with Patient 06/05/18 1623     SUBJECTIVE HPI: Julia Costa is a 27 y.o. [id number] at [redacted]w[redacted]d who presents to Maternity Admissions reporting lower abdominal cramping & vaginal discharge. Symptoms started a few weeks ago. Has not started prenatal care yet. Reports foul smelling discharge; no itching or irritation. Denies vaginal bleeding. Also reports some intermittent lower abdominal pain. Denies n/v/d, fever, dysuria.   Location: lower abdomen Quality: cramping Severity: 7/10 on pain scale Duration: 2 weeks Timing: intermittent Modifying factors: nothing makes better or worse Associated signs and symptoms: vaginal discharge  No past medical history on file. OB History  Gravida Para Term Preterm AB Living  7 4 4   2 4   SAB TAB Ectopic Multiple Live Births  1 1   0 4    # Outcome Date GA Lbr Len/2nd Weight Sex Delivery Anes PTL Lv  7 Current           6 Term 11/06/17 [redacted]w[redacted]d 05:20 / 00:26 3544 g M Vag-Spont EPI  LIV     Birth Comments: WNL  5 SAB 08/04/16    U      4 Term 07/13/15 [redacted]w[redacted]d 20:34 / 00:14 3000 g F Vag-Spont EPI  LIV  3 Term 06/26/11    F Vag-Spont   LIV  2 Term 12/29/09    F Vag-Spont   LIV  1 TAB 09/2007           Past Surgical History:  Procedure Laterality Date  . NO PAST SURGERIES    . termination of pregnancy     Social History   Socioeconomic History  . Marital status: Single    Spouse name: Not on file  . Number of children: Not on file  . Years of education: Not on file  . Highest education level: Not on file  Occupational History  . Not on file  Social Needs  . Financial resource strain: Not on file  . Food insecurity:    Worry: Not on file    Inability: Not on file  . Transportation needs:    Medical: Not on file    Non-medical: Not on file  Tobacco Use  . Smoking status: Never Smoker  . Smokeless tobacco: Never Used  Substance and Sexual Activity  .  Alcohol use: No  . Drug use: No  . Sexual activity: Yes    Birth control/protection: None  Lifestyle  . Physical activity:    Days per week: Not on file    Minutes per session: Not on file  . Stress: Not on file  Relationships  . Social connections:    Talks on phone: Not on file    Gets together: Not on file    Attends religious service: Not on file    Active member of club or organization: Not on file    Attends meetings of clubs or organizations: Not on file    Relationship status: Not on file  . Intimate partner violence:    Fear of current or ex partner: Not on file    Emotionally abused: Not on file    Physically abused: Not on file    Forced sexual activity: Not on file  Other Topics Concern  . Not on file  Social History Narrative  . Not on file   Family History  Problem Relation Age of Onset  . Alcohol abuse Neg Hx   . Anxiety disorder Neg Hx   .  Arthritis Neg Hx   . Asthma Neg Hx   . Birth defects Neg Hx   . Cancer Neg Hx   . COPD Neg Hx   . Depression Neg Hx   . Diabetes Neg Hx   . Early death Neg Hx   . Drug abuse Neg Hx   . Hearing loss Neg Hx   . Heart disease Neg Hx   . Hypertension Neg Hx   . Hyperlipidemia Neg Hx   . Intellectual disability Neg Hx   . Kidney disease Neg Hx   . Learning disabilities Neg Hx   . Miscarriages / Stillbirths Neg Hx   . Obesity Neg Hx   . Stroke Neg Hx   . Vision loss Neg Hx   . Varicose Veins Neg Hx   . ADD / ADHD Neg Hx    No current facility-administered medications on file prior to encounter.    Current Outpatient Medications on File Prior to Encounter  Medication Sig Dispense Refill  . ibuprofen (ADVIL,MOTRIN) 600 MG tablet Take 1 tablet (600 mg total) by mouth every 6 (six) hours. 30 tablet 0  . norgestimate-ethinyl estradiol (ORTHO-CYCLEN,SPRINTEC,PREVIFEM) 0.25-35 MG-MCG tablet Take 1 tablet by mouth daily. 1 Package 11  . Prenatal Vit-Fe Fumarate-FA (PRENATAL MULTIVITAMIN) TABS tablet Take 1 tablet by  mouth daily at 12 noon.     No Known Allergies  I have reviewed patient's Past Medical Hx, Surgical Hx, Family Hx, Social Hx, medications and allergies.   Review of Systems  Constitutional: Negative.   Gastrointestinal: Positive for abdominal pain. Negative for constipation, diarrhea, nausea and vomiting.  Genitourinary: Positive for vaginal discharge. Negative for dysuria and vaginal bleeding.    OBJECTIVE Patient Vitals for the past 24 hrs:  BP Temp Temp src Pulse Resp Height Weight  06/05/18 1529 114/66 98.1 F (36.7 C) Oral 81 16 5\' 4"  (1.626 m) 69.4 kg   Constitutional: Well-developed, well-nourished female in no acute distress.  Cardiovascular: normal rate & rhythm, no murmur Respiratory: normal rate and effort. Lung sounds clear throughout GI: Abd soft, non-tender, Pos BS x 4. No guarding or rebound tenderness MS: Extremities nontender, no edema, normal ROM Neurologic: Alert and oriented x 4.    LAB RESULTS Results for orders placed or performed during the hospital encounter of 06/05/18 (from the past 24 hour(s))  Pregnancy, urine POC     Status: Abnormal   Collection Time: 06/05/18  3:43 PM  Result Value Ref Range   Preg Test, Ur POSITIVE (A) NEGATIVE  Urinalysis, Routine w reflex microscopic     Status: Abnormal   Collection Time: 06/05/18  3:47 PM  Result Value Ref Range   Color, Urine YELLOW YELLOW   APPearance HAZY (A) CLEAR   Specific Gravity, Urine 1.019 1.005 - 1.030   pH 7.0 5.0 - 8.0   Glucose, UA NEGATIVE NEGATIVE mg/dL   Hgb urine dipstick LARGE (A) NEGATIVE   Bilirubin Urine NEGATIVE NEGATIVE   Ketones, ur 20 (A) NEGATIVE mg/dL   Protein, ur 914100 (A) NEGATIVE mg/dL   Nitrite NEGATIVE NEGATIVE   Leukocytes, UA NEGATIVE NEGATIVE   RBC / HPF 6-10 0 - 5 RBC/hpf   WBC, UA 0-5 0 - 5 WBC/hpf   Bacteria, UA RARE (A) NONE SEEN   Squamous Epithelial / LPF 6-10 0 - 5   Mucus PRESENT   Wet prep, genital     Status: None   Collection Time: 06/05/18  4:17 PM   Result Value Ref Range   Yeast  Wet Prep HPF POC NONE SEEN NONE SEEN   Trich, Wet Prep NONE SEEN NONE SEEN   Clue Cells Wet Prep HPF POC NONE SEEN NONE SEEN   WBC, Wet Prep HPF POC NONE SEEN NONE SEEN   Sperm PRESENT     IMAGING No results found.  MAU COURSE Orders Placed This Encounter  Procedures  . Wet prep, genital  . Culture, OB Urine  . Urinalysis, Routine w reflex microscopic  . Pregnancy, urine POC  . Discharge patient   No orders of the defined types were placed in this encounter.   MDM GC/CT & wet prep collected by blind swab.  Declines pelvic exam FHT present by doppler  ASSESSMENT 1. Vaginal discharge during pregnancy in first trimester   2. [redacted] weeks gestation of pregnancy     PLAN Discharge home in stable condition. SAB precautions GC/CT pending Start prenatal care  Follow-up Information    [facility] Follow up.   Contact information: [address] [place] [address] [phone]         Allergies as of 06/05/2018   No Known Allergies     Medication List    STOP taking these medications   ibuprofen 600 MG tablet Commonly known as:  ADVIL,MOTRIN   norgestimate-ethinyl estradiol 0.25-35 MG-MCG tablet Commonly known as:  ORTHO-CYCLEN,SPRINTEC,PREVIFEM     TAKE these medications   prenatal multivitamin Tabs tablet Take 1 tablet by mouth daily at 12 noon.        Judeth Horn, NP 06/05/2018  5:39 PM

## 2018-06-06 LAB — GC/CHLAMYDIA PROBE AMP (~~LOC~~) NOT AT ARMC
Chlamydia: NEGATIVE
Neisseria Gonorrhea: NEGATIVE

## 2018-06-06 LAB — CULTURE, OB URINE: Culture: <10000 — AB

## 2018-06-25 ENCOUNTER — Other Ambulatory Visit (HOSPITAL_COMMUNITY)
Admission: RE | Admit: 2018-06-25 | Discharge: 2018-06-25 | Disposition: A | Discharge status: Home / Self Care | Payer: Medicaid Other | Source: Ambulatory Visit | Attending: Obstetrics | Admitting: Obstetrics

## 2018-06-25 ENCOUNTER — Ambulatory Visit (INDEPENDENT_AMBULATORY_CARE_PROVIDER_SITE_OTHER): Payer: Medicaid Other | Admitting: Obstetrics

## 2018-06-25 ENCOUNTER — Encounter: Payer: Self-pay | Admitting: Obstetrics

## 2018-06-25 VITALS — BP 113/77 | HR 101 | Wt 149.6 lb

## 2018-06-25 DIAGNOSIS — K219 Gastro-esophageal reflux disease without esophagitis: Secondary | ICD-10-CM

## 2018-06-25 DIAGNOSIS — Z3490 Encounter for supervision of normal pregnancy, unspecified, unspecified trimester: Secondary | ICD-10-CM | POA: Diagnosis present

## 2018-06-25 DIAGNOSIS — Z3492 Encounter for supervision of normal pregnancy, unspecified, second trimester: Secondary | ICD-10-CM

## 2018-06-25 DIAGNOSIS — Z3482 Encounter for supervision of other normal pregnancy, second trimester: Secondary | ICD-10-CM

## 2018-06-25 DIAGNOSIS — O09892 Supervision of other high risk pregnancies, second trimester: Secondary | ICD-10-CM

## 2018-06-25 DIAGNOSIS — O09899 Supervision of other high risk pregnancies, unspecified trimester: Secondary | ICD-10-CM

## 2018-06-25 DIAGNOSIS — N898 Other specified noninflammatory disorders of vagina: Secondary | ICD-10-CM

## 2018-06-25 HISTORY — DX: Encounter for supervision of normal pregnancy, unspecified, unspecified trimester: Z34.90

## 2018-06-25 MED ORDER — TINIDAZOLE 500 MG PO TABS: 1000.0000 mg | ORAL_TABLET | Freq: Every day | ORAL | 2 refills | Status: DC

## 2018-06-25 MED ORDER — VITAFOL ULTRA 29-0.6-0.4-200 MG PO CAPS: 1.0000 | ORAL_CAPSULE | Freq: Every day | ORAL | 4 refills | Status: DC

## 2018-06-25 MED ORDER — OMEPRAZOLE 20 MG PO CPDR: 20.0000 mg | DELAYED_RELEASE_CAPSULE | Freq: Two times a day (BID) | ORAL | 5 refills | Status: DC

## 2018-06-25 NOTE — Progress Notes (Addendum)
Subjective:    Julia Costa is being seen today for her first obstetrical visit.  This is not a planned pregnancy. She is at [redacted]w[redacted]d gestation. Her obstetrical history is significant for none. Relationship with FOB: significant other, living together. Patient does intend to breast feed. Pregnancy history fully reviewed.  The information documented in the HPI was reviewed and verified.  Menstrual History: OB History    Gravida  7   Para  4   Term  4   Preterm      AB  2   Living  4     SAB  1   TAB  1   Ectopic      Multiple  0   Live Births  4            Patient's last menstrual period was 03/09/2018.    History reviewed. No pertinent past medical history.  Past Surgical History:  Procedure Laterality Date  . NO PAST SURGERIES    . termination of pregnancy       (Not in a hospital admission) No Known Allergies  Social History   Tobacco Use  . Smoking status: Never Smoker  . Smokeless tobacco: Never Used  Substance Use Topics  . Alcohol use: No    Family History  Problem Relation Age of Onset  . Alcohol abuse Neg Hx   . Anxiety disorder Neg Hx   . Arthritis Neg Hx   . Asthma Neg Hx   . Birth defects Neg Hx   . Cancer Neg Hx   . COPD Neg Hx   . Depression Neg Hx   . Diabetes Neg Hx   . Early death Neg Hx   . Drug abuse Neg Hx   . Hearing loss Neg Hx   . Heart disease Neg Hx   . Hypertension Neg Hx   . Hyperlipidemia Neg Hx   . Intellectual disability Neg Hx   . Kidney disease Neg Hx   . Learning disabilities Neg Hx   . Miscarriages / Stillbirths Neg Hx   . Obesity Neg Hx   . Stroke Neg Hx   . Vision loss Neg Hx   . Varicose Veins Neg Hx   . ADD / ADHD Neg Hx      Review of Systems Constitutional: negative for weight loss Gastrointestinal: negative for vomiting Genitourinary:negative for genital lesions and vaginal discharge and dysuria Musculoskeletal:negative for back pain Behavioral/Psych: negative for abusive relationship,  depression, illegal drug usage and tobacco use    Objective:    BP 113/77   Pulse (!) 101   Wt 149 lb 9.6 oz (67.9 kg)   LMP 03/09/2018   BMI 25.68 kg/m  General Appearance:    Alert, cooperative, no distress, appears stated age  Head:    Normocephalic, without obvious abnormality, atraumatic  Eyes:    PERRL, conjunctiva/corneas clear, EOM's intact, fundi    benign, both eyes  Ears:    Normal TM's and external ear canals, both ears  Nose:   Nares normal, septum midline, mucosa normal, no drainage    or sinus tenderness  Throat:   Lips, mucosa, and tongue normal; teeth and gums normal  Neck:   Supple, symmetrical, trachea midline, no adenopathy;    thyroid:  no enlargement/tenderness/nodules; no carotid   bruit or JVD  Back:     Symmetric, no curvature, ROM normal, no CVA tenderness  Lungs:     Clear to auscultation bilaterally, respirations unlabored  Chest Wall:  No tenderness or deformity   Heart:    Regular rate and rhythm, S1 and S2 normal, no murmur, rub   or gallop  Breast Exam:    No tenderness, masses, or nipple abnormality  Abdomen:     Soft, non-tender, bowel sounds active all four quadrants,    no masses, no organomegaly  Genitalia:    Normal female without lesion, discharge or tenderness  Extremities:   Extremities normal, atraumatic, no cyanosis or edema  Pulses:   2+ and symmetric all extremities  Skin:   Skin color, texture, turgor normal, no rashes or lesions  Lymph nodes:   Cervical, supraclavicular, and axillary nodes normal  Neurologic:   CNII-XII intact, normal strength, sensation and reflexes    throughout      Lab Review Urine pregnancy test Labs reviewed yes Radiologic studies reviewed no   Assessment:    Pregnancy at 5370w3d weeks    Plan:                                  1. Encounter for supervision of normal pregnancy, antepartum, unspecified gravidity Rx: - Hemoglobinopathy evaluation - Obstetric Panel, Including HIV - Inheritest(R) CF/SMA  Panel - Genetic Screening - Cytology - PAP( [place]) - Cervicovaginal ancillary only( [place]) - AFP, Serum, Open Spina Bifida - US MFM OB COMP + 14 WK; Future - Prenat-Fe Poly-Methfol-FA-DHA (VITAFOL ULTRA) 29-0.6-0.4-200 MG CAPS; Take 1 capsule by mouth daily before breakfast.  Dispense: 90 capsule; Refill: 4  2. Short interval between pregnancies affecting pregnancy, antepartum  3. Vaginal discharge Rx: - tinidazole (TINDAMAX) 500 MG tablet; Take 2 tablets (1,000 mg total) by mouth daily with breakfast.  Dispense: 10 tablet; Refill: 2  4. GERD without esophagitis Rx: - omeprazole (PRILOSEC) 20 MG capsule; Take 1 capsule (20 mg total) by mouth 2 (two) times daily before a meal.  Dispense: 60 capsule; Refill: 5                                                             Prenatal vitamins.  Counseling provided regarding continued use of seat belts, cessation of alcohol consumption, smoking or use of illicit drugs; infection precautions i.e., influenza/TDAP immunizations, toxoplasmosis,CMV, parvovirus, listeria and varicella; workplace safety, exercise during pregnancy; routine dental care, safe medications, sexual activity, hot tubs, saunas, pools, travel, caffeine use, fish and methlymercury, potential toxins, hair treatments, varicose veins Weight gain recommendations per IOM guidelines reviewed: underweight/BMI< 18.5--> gain 28 - 40 lbs; normal weight/BMI 18.5 - 24.9--> gain 25 - 35 lbs; overweight/BMI 25 - 29.9--> gain 15 - 25 lbs; obese/BMI >30->gain  11 - 20 lbs Problem list reviewed and updated. FIRST/CF mutation testing/NIPT/QUAD SCREEN/fragile X/Ashkenazi Jewish population testing/Spinal muscular atrophy discussed: requested. Role of ultrasound in pregnancy discussed; fetal survey: requested. Amniocentesis discussed: not indicated. VBAC calculator score: VBAC consent form provided Meds ordered this encounter  Medications  . omeprazole (PRILOSEC) 20 MG capsule    Sig:  Take 1 capsule (20 mg total) by mouth 2 (two) times daily before a meal.    Dispense:  60 capsule    Refill:  5  . Prenat-Fe Poly-Methfol-FA-DHA (VITAFOL ULTRA) 29-0.6-0.4-200 MG CAPS    Sig: Take 1 capsule by mouth daily  before breakfast.    Dispense:  90 capsule    Refill:  4   Orders Placed This Encounter  Procedures  . [place] MFM OB COMP + 14 WK    Standing Status:   Future    Standing Expiration Date:   08/27/2019    Order Specific Question:   Reason for Exam (SYMPTOM  OR DIAGNOSIS REQUIRED)    Answer:   Anatomy    Order Specific Question:   Preferred Imaging Location?    Answer:   [facility]-Ultrasound  . Hemoglobinopathy evaluation  . Obstetric Panel, Including HIV  . Inheritest(R) CF/SMA Panel  . Genetic Screening  . AFP, Serum, Open Spina Bifida    Follow up in 4 weeks. 50% of 20 min visit spent on counseling and coordination of care.     Brock Bad MD 06-25-2018

## 2018-06-25 NOTE — Progress Notes (Signed)
Pt presents for Initial OB visit. Pt does live with FOB. This was not a planned pregnancy.

## 2018-06-25 NOTE — Addendum Note (Signed)
Addended by: Coral CeoHARPER, Murl Golladay A on: 06/25/2018 11:22 AM   Modules accepted: Orders

## 2018-06-26 ENCOUNTER — Telehealth: Payer: Self-pay

## 2018-06-26 LAB — CERVICOVAGINAL ANCILLARY ONLY
Bacterial vaginitis: POSITIVE — AB
CANDIDA VAGINITIS: NEGATIVE
Chlamydia: NEGATIVE
NEISSERIA GONORRHEA: NEGATIVE
TRICH (WINDOWPATH): NEGATIVE

## 2018-06-26 LAB — CYTOLOGY - PAP: Diagnosis: NEGATIVE

## 2018-06-26 NOTE — Telephone Encounter (Signed)
Returned call, no answer, left vm 

## 2018-06-27 LAB — OBSTETRIC PANEL, INCLUDING HIV
Antibody Screen: NEGATIVE
BASOS ABS: 0 10*3/uL (ref 0.0–0.2)
Basos: 0 %
EOS (ABSOLUTE): 0 10*3/uL (ref 0.0–0.4)
EOS: 1 %
HEP B S AG: NEGATIVE
HIV SCREEN 4TH GENERATION: NONREACTIVE
Hematocrit: 37.5 % (ref 34.0–46.6)
Hemoglobin: 12.7 g/dL (ref 11.1–15.9)
IMMATURE GRANULOCYTES: 0 %
Immature Grans (Abs): 0 10*3/uL (ref 0.0–0.1)
Lymphocytes Absolute: 2.4 10*3/uL (ref 0.7–3.1)
Lymphs: 31 %
MCH: 31 pg (ref 26.6–33.0)
MCHC: 33.9 g/dL (ref 31.5–35.7)
MCV: 92 fL (ref 79–97)
Monocytes Absolute: 0.6 10*3/uL (ref 0.1–0.9)
Monocytes: 7 %
NEUTROS PCT: 61 %
Neutrophils Absolute: 4.7 10*3/uL (ref 1.4–7.0)
PLATELETS: 277 10*3/uL (ref 150–450)
RBC: 4.1 x10E6/uL (ref 3.77–5.28)
RDW: 12 % — ABNORMAL LOW (ref 12.3–15.4)
RH TYPE: POSITIVE
RPR: NONREACTIVE
Rubella Antibodies, IGG: 2.34 index (ref >0.99)
WBC: 7.7 10*3/uL (ref 3.4–10.8)

## 2018-06-27 LAB — HEMOGLOBINOPATHY EVALUATION
HGB A: 96.5 % (ref 96.4–98.8)
HGB C: 0 %
HGB S: 0 %
HGB VARIANT: 0 %
Hemoglobin A2 Quantitation: 2.5 % (ref 1.8–3.2)
Hemoglobin F Quantitation: 1 % (ref 0.0–2.0)

## 2018-06-29 ENCOUNTER — Telehealth: Payer: Self-pay

## 2018-06-29 NOTE — Telephone Encounter (Signed)
Returned call, advised panorama results are not back yet.

## 2018-07-03 ENCOUNTER — Encounter: Payer: Self-pay | Admitting: *Deleted

## 2018-07-06 LAB — AFP, SERUM, OPEN SPINA BIFIDA
AFP MOM: 1.03
AFP Value: 35.7 ng/mL
GEST. AGE ON COLLECTION DATE: 15.6 wk
MATERNAL AGE AT EDD: 28 a
OSBR RISK 1 IN: 10000
TEST RESULTS AFP: NEGATIVE
WEIGHT: 149 [lb_av]

## 2018-07-06 LAB — INHERITEST(R) CF/SMA PANEL

## 2018-07-13 ENCOUNTER — Encounter (HOSPITAL_COMMUNITY): Payer: Self-pay

## 2018-07-18 NOTE — L&D Delivery Note (Signed)
Delivery Note Pt progressed to complete at 1900. She pushed once, and at 7:09 PM a viable female was delivered via Vaginal, Spontaneous (Presentation: ROA).  APGAR: 8, 9; weight 3116 gm (6lb 13.9oz).  Infant dried and placed on pt's abd; cord clamped and cut by FOB after 1 min delay; hospital cord blood sample collected. Placenta status: spont , intact.  Cord: 3 vessels  Anesthesia:  Epidural Episiotomy: None Lacerations: None Est. Blood Loss (mL):  50  Post-Placental IUD Insertion Procedure Note  Patient identified, informed consent signed prior to delivery, signed copy in chart, time out was performed.    Vaginal, labial and perineal areas thoroughly inspected for lacerations- none found.  - IUD grasped between sterile gloved fingers. Sterile lubrication applied to sterile gloved hand for ease of insertion. Fundus identified through abdominal wall using non-insertion hand. IUD inserted to fundus with bimanual technique. IUD carefully released at the fundus and insertion hand gently removed from vagina.   Strings trimmed to the level of the introitus. Patient tolerated procedure well.  Lot # [id number] Expiration Date: 05/2022 (see photo of packaging under media)  Patient given post procedure instructions and IUD care card with expiration date.  Patient is asked to keep IUD strings tucked in her vagina until her postpartum follow up visit in 4-6 weeks. Patient advised to abstain from sexual intercourse and pulling on strings before her follow-up visit. Patient verbalized an understanding of the plan of care and agrees.   Mom to postpartum.  Baby to Couplet care / Skin to Skin.  Arabella Merles CNM 12/04/2018, 7:32 PM  Please schedule this patient for Postpartum visit in: 4 weeks with the following provider: Any provider For C/S patients schedule nurse incision check in weeks 2 weeks: no Low risk pregnancy complicated by: none Delivery mode:  SVD Anticipated Birth Control:  IUD- Liletta  placed postplacentally PP Procedures needed: none  Schedule Integrated BH visit: no

## 2018-07-20 ENCOUNTER — Ambulatory Visit (HOSPITAL_COMMUNITY)
Admission: RE | Admit: 2018-07-20 | Discharge: 2018-07-20 | Disposition: A | Discharge status: Home / Self Care | Payer: Medicaid Other | Source: Ambulatory Visit | Attending: Obstetrics | Admitting: Obstetrics

## 2018-07-20 DIAGNOSIS — Z3A19 19 weeks gestation of pregnancy: Secondary | ICD-10-CM | POA: Diagnosis not present

## 2018-07-20 DIAGNOSIS — Z3490 Encounter for supervision of normal pregnancy, unspecified, unspecified trimester: Secondary | ICD-10-CM

## 2018-07-20 DIAGNOSIS — Z363 Encounter for antenatal screening for malformations: Secondary | ICD-10-CM | POA: Diagnosis not present

## 2018-07-23 ENCOUNTER — Encounter: Payer: Medicaid Other | Admitting: Obstetrics

## 2018-07-25 ENCOUNTER — Telehealth: Payer: Self-pay | Admitting: Obstetrics

## 2018-08-02 ENCOUNTER — Encounter: Payer: Self-pay | Admitting: Obstetrics

## 2018-08-02 NOTE — Telephone Encounter (Signed)
Attempted to call, female answered, unable to leave message. Also mailed letter to call office to schedule appointment.

## 2018-08-08 ENCOUNTER — Ambulatory Visit (INDEPENDENT_AMBULATORY_CARE_PROVIDER_SITE_OTHER): Payer: Medicaid Other | Admitting: Obstetrics and Gynecology

## 2018-08-08 VITALS — BP 106/68 | HR 78 | Wt 150.2 lb

## 2018-08-08 DIAGNOSIS — O09892 Supervision of other high risk pregnancies, second trimester: Secondary | ICD-10-CM

## 2018-08-08 DIAGNOSIS — Z3482 Encounter for supervision of other normal pregnancy, second trimester: Secondary | ICD-10-CM

## 2018-08-08 DIAGNOSIS — O09899 Supervision of other high risk pregnancies, unspecified trimester: Secondary | ICD-10-CM

## 2018-08-08 DIAGNOSIS — Z3480 Encounter for supervision of other normal pregnancy, unspecified trimester: Secondary | ICD-10-CM

## 2018-08-08 HISTORY — DX: Supervision of other high risk pregnancies, unspecified trimester: O09.899

## 2018-08-08 NOTE — Progress Notes (Signed)
Prenatal Visit Note Date: 08/08/2018 Clinic: [facility]  Subjective:  Julia Costa is a 28 y.o. [id number] at [redacted]w[redacted]d being seen today for ongoing prenatal care.  She is currently monitored for the following issues for this low-risk pregnancy and has Anxiety disorder; Suicidal behavior; Encounter for supervision of normal pregnancy, unspecified, unspecified trimester; and Short interval between pregnancies affecting pregnancy, antepartum on their problem list.  Patient reports see RN note.   Contractions: Not present. Vag. Bleeding: None.  Movement: Present. Denies leaking of fluid.   The following portions of the patient's history were reviewed and updated as appropriate: allergies, current medications, past family history, past medical history, past social history, past surgical history and problem list. Problem list updated.  Objective:   Vitals:   08/08/18 0817  BP: 106/68  Pulse: 78  Weight: 150 lb 3.2 oz (68.1 kg)    Fetal Status: Fetal Heart Rate (bpm): 149   Movement: Present     General:  Alert, oriented and cooperative. Patient is in no acute distress.  Skin: Skin is warm and dry. No rash noted.   Cardiovascular: Normal heart rate noted  Respiratory: Normal respiratory effort, no problems with respiration noted  Abdomen: Soft, gravid, appropriate for gestational age. Pain/Pressure: Absent     Pelvic:  Cervical exam deferred        Extremities: Normal range of motion.  Edema: None  Mental Status: Normal mood and affect. Normal behavior. Normal judgment and thought content.   Urinalysis:      Assessment and Plan:  Pregnancy: A4T3646 at [redacted]w[redacted]d  1. Supervision of other normal pregnancy, antepartum Has refill on tindamax. All labs and u/s normal  2. Short interval between pregnancies affecting pregnancy, antepartum D/w pt more re: BC nv  Preterm labor symptoms and general obstetric precautions including but not limited to vaginal bleeding, contractions, leaking of fluid and  fetal movement were reviewed in detail with the patient. Please refer to After Visit Summary for other counseling recommendations.  Return in about 3 weeks (around 08/29/2018) for rob.   [place] Bing, MD

## 2018-08-08 NOTE — Progress Notes (Signed)
Patient reports white vaginal discharge with odor that began last week. Patient advised of medication refills on Tinidazole (Tindamax) 500 mg 2 tablets (1,000 mg)  Oral daily with breakfast for 5 days. Patient states she will get refill.

## 2018-08-29 ENCOUNTER — Encounter: Payer: Self-pay | Admitting: Obstetrics and Gynecology

## 2018-08-29 ENCOUNTER — Other Ambulatory Visit (HOSPITAL_COMMUNITY)
Admission: RE | Admit: 2018-08-29 | Discharge: 2018-08-29 | Disposition: A | Discharge status: Home / Self Care | Payer: Medicaid Other | Source: Ambulatory Visit | Attending: Obstetrics and Gynecology | Admitting: Obstetrics and Gynecology

## 2018-08-29 ENCOUNTER — Other Ambulatory Visit: Payer: Self-pay

## 2018-08-29 ENCOUNTER — Ambulatory Visit (INDEPENDENT_AMBULATORY_CARE_PROVIDER_SITE_OTHER): Payer: Medicaid Other | Admitting: Obstetrics and Gynecology

## 2018-08-29 VITALS — BP 95/56 | HR 76 | Wt 152.0 lb

## 2018-08-29 DIAGNOSIS — O09892 Supervision of other high risk pregnancies, second trimester: Secondary | ICD-10-CM

## 2018-08-29 DIAGNOSIS — Z3480 Encounter for supervision of other normal pregnancy, unspecified trimester: Secondary | ICD-10-CM

## 2018-08-29 DIAGNOSIS — Z3A24 24 weeks gestation of pregnancy: Secondary | ICD-10-CM

## 2018-08-29 DIAGNOSIS — N898 Other specified noninflammatory disorders of vagina: Secondary | ICD-10-CM

## 2018-08-29 DIAGNOSIS — Z3482 Encounter for supervision of other normal pregnancy, second trimester: Secondary | ICD-10-CM

## 2018-08-29 DIAGNOSIS — O09899 Supervision of other high risk pregnancies, unspecified trimester: Secondary | ICD-10-CM

## 2018-08-29 NOTE — Progress Notes (Signed)
ROB.  Wants refills on Tindamax

## 2018-08-29 NOTE — Patient Instructions (Signed)

## 2018-08-29 NOTE — Progress Notes (Signed)
Subjective:  Julia Costa is a 28 y.o. [id number] at 4419w5d being seen today for ongoing prenatal care.  She is currently monitored for the following issues for this low-risk pregnancy and has Anxiety disorder; Suicidal behavior; Encounter for supervision of normal pregnancy, unspecified, unspecified trimester; and Short interval between pregnancies affecting pregnancy, antepartum on their problem list.  Patient reports vaginal discharge.  Contractions: Not present. Vag. Bleeding: None.  Movement: Present. Denies leaking of fluid.   The following portions of the patient's history were reviewed and updated as appropriate: allergies, current medications, past family history, past medical history, past social history, past surgical history and problem list. Problem list updated.  Objective:   Vitals:   08/29/18 0854  BP: (!) 95/56  Pulse: 76  Weight: 152 lb (68.9 kg)    Fetal Status: Fetal Heart Rate (bpm): 141   Movement: Present     General:  Alert, oriented and cooperative. Patient is in no acute distress.  Skin: Skin is warm and dry. No rash noted.   Cardiovascular: Normal heart rate noted  Respiratory: Normal respiratory effort, no problems with respiration noted  Abdomen: Soft, gravid, appropriate for gestational age. Pain/Pressure: Absent     Pelvic:  Cervical exam deferred        Extremities: Normal range of motion.  Edema: None  Mental Status: Normal mood and affect. Normal behavior. Normal judgment and thought content.   Urinalysis:      Assessment and Plan:  Pregnancy: A5W0981G7P4024 at 4519w5d  1. Supervision of other normal pregnancy, antepartum Self swab for vaginal discharge. Will Tx as per results Glucola next visit  2. Short interval between pregnancies affecting pregnancy, antepartum Desires BTL To sign BTL papers at next visit  Preterm labor symptoms and general obstetric precautions including but not limited to vaginal bleeding, contractions, leaking of fluid and fetal  movement were reviewed in detail with the patient. Please refer to After Visit Summary for other counseling recommendations.  Return in about 3 weeks (around 09/19/2018) for OB visit.   Hermina StaggersErvin, Pamelia Botto L, MD

## 2018-08-30 LAB — CERVICOVAGINAL ANCILLARY ONLY
Bacterial vaginitis: POSITIVE — AB
CANDIDA VAGINITIS: POSITIVE — AB
CHLAMYDIA, DNA PROBE: NEGATIVE
Neisseria Gonorrhea: NEGATIVE
TRICH (WINDOWPATH): NEGATIVE

## 2018-08-31 ENCOUNTER — Other Ambulatory Visit: Payer: Self-pay | Admitting: *Deleted

## 2018-08-31 DIAGNOSIS — N760 Acute vaginitis: Secondary | ICD-10-CM

## 2018-08-31 DIAGNOSIS — B9689 Other specified bacterial agents as the cause of diseases classified elsewhere: Secondary | ICD-10-CM

## 2018-08-31 DIAGNOSIS — B3731 Acute candidiasis of vulva and vagina: Secondary | ICD-10-CM

## 2018-08-31 DIAGNOSIS — B373 Candidiasis of vulva and vagina: Secondary | ICD-10-CM

## 2018-08-31 MED ORDER — TERCONAZOLE 0.4 % VA CREA: 1.0000 | TOPICAL_CREAM | Freq: Every day | VAGINAL | 0 refills | Status: DC

## 2018-08-31 MED ORDER — METRONIDAZOLE 500 MG PO TABS: 500.0000 mg | ORAL_TABLET | Freq: Two times a day (BID) | ORAL | 0 refills | Status: DC

## 2018-08-31 NOTE — Progress Notes (Signed)
See lab note.  

## 2018-09-19 ENCOUNTER — Other Ambulatory Visit: Payer: Medicaid Other

## 2018-09-19 ENCOUNTER — Encounter: Payer: Self-pay | Admitting: Obstetrics and Gynecology

## 2018-09-19 ENCOUNTER — Ambulatory Visit (INDEPENDENT_AMBULATORY_CARE_PROVIDER_SITE_OTHER): Payer: Medicaid Other | Admitting: Obstetrics and Gynecology

## 2018-09-19 DIAGNOSIS — Z3480 Encounter for supervision of other normal pregnancy, unspecified trimester: Secondary | ICD-10-CM

## 2018-09-19 DIAGNOSIS — Z3009 Encounter for other general counseling and advice on contraception: Secondary | ICD-10-CM

## 2018-09-19 DIAGNOSIS — Z3482 Encounter for supervision of other normal pregnancy, second trimester: Secondary | ICD-10-CM

## 2018-09-19 DIAGNOSIS — Z3A27 27 weeks gestation of pregnancy: Secondary | ICD-10-CM

## 2018-09-19 HISTORY — DX: Encounter for other general counseling and advice on contraception: Z30.09

## 2018-09-19 NOTE — Progress Notes (Signed)
Subjective:  Julia Costa is a 28 y.o. A0U0156 at [redacted]w[redacted]d being seen today for ongoing prenatal care.  She is currently monitored for the following issues for this low-risk pregnancy and has Anxiety disorder; Suicidal behavior; Encounter for supervision of normal pregnancy, unspecified, unspecified trimester; Short interval between pregnancies affecting pregnancy, antepartum; and Unwanted fertility on their problem list.  Patient reports no complaints.  Contractions: Not present. Vag. Bleeding: None.  Movement: Present. Denies leaking of fluid.   The following portions of the patient's history were reviewed and updated as appropriate: allergies, current medications, past family history, past medical history, past social history, past surgical history and problem list. Problem list updated.  Objective:   Vitals:   09/19/18 0820  BP: 106/64  Pulse: 77  Weight: 154 lb 1.6 oz (69.9 kg)    Fetal Status: Fetal Heart Rate (bpm): 150   Movement: Present     General:  Alert, oriented and cooperative. Patient is in no acute distress.  Skin: Skin is warm and dry. No rash noted.   Cardiovascular: Normal heart rate noted  Respiratory: Normal respiratory effort, no problems with respiration noted  Abdomen: Soft, gravid, appropriate for gestational age. Pain/Pressure: Absent     Pelvic:  Cervical exam deferred        Extremities: Normal range of motion.  Edema: None  Mental Status: Normal mood and affect. Normal behavior. Normal judgment and thought content.   Urinalysis:      Assessment and Plan:  Pregnancy: F5P7943 at [redacted]w[redacted]d  1. Supervision of other normal pregnancy, antepartum Stable Glucola today  2. Unwanted fertility BTL papers signed and PP BTL reviewed. Pt information provided  Preterm labor symptoms and general obstetric precautions including but not limited to vaginal bleeding, contractions, leaking of fluid and fetal movement were reviewed in detail with the patient. Please refer  to After Visit Summary for other counseling recommendations.  Return in about 2 weeks (around 10/03/2018) for OB visit.   Hermina Staggers, MD

## 2018-09-19 NOTE — Patient Instructions (Signed)
Postpartum Tubal Ligation, Care After Refer to this sheet in the next few weeks. These instructions provide you with information about caring for yourself after your procedure. Your health care provider may also give you more specific instructions. Your treatment has been planned according to current medical practices, but problems sometimes occur. Call your health care provider if you have any problems or questions after your procedure. What can I expect after the procedure? After the procedure, it is common to have:  A sore throat.  Bruising or pain in your back.  Nausea or vomiting.  Dizziness.  Mild abdominal discomfort or pain, such as cramping, gas pain, or feeling bloated.  Soreness where the incision was made.  Tiredness.  Pain in your shoulders. Follow these instructions at home: Medicines  Take over-the-counter and prescription medicines only as told by your health care provider.  Do not take aspirin because it can cause bleeding.  Do not drive or operate heavy machinery while taking prescription pain medicine. Activity  Rest for the rest of the day.  Gradually return to your normal activities over the next few days.  Do not have sex, douche, or put a tampon or anything else in your vagina for 6 weeks or as long as told by your health care provider.  Do not lift anything that is heavier than your baby for 2 weeks or as long as told by your health care provider. Incision care      Follow instructions from your health care provider about how to take care of your incision. Make sure you: ? Wash your hands with soap and water before you change your bandage (dressing). If soap and water are not available, use hand sanitizer. ? Change your dressing as told by your health care provider. ? Leave stitches (sutures) in place. They may need to stay in place for 2 weeks or longer.  Check your incision area every day for signs of infection. Check for: ? More redness,  swelling, or pain. ? More fluid or blood. ? Warmth. ? Pus or a bad smell. Other Instructions  Do not take baths, swim, or use a hot tub until your health care provider approves. You may take showers.  Keep all follow-up visits as told by your health care provider. This is important. Contact a health care provider if:  You have more redness, swelling, or pain around your incision.  Your incision feels warm to the touch.  You have pus or a bad smell coming from your incision.  The edges of your incision break open after the sutures have been removed.  Your pain does not improve after 2-3 days.  You have a rash.  You repeatedly become dizzy or lightheaded.  Your pain medicine is not helping.  You are constipated. Get help right away if:  You have a fever.  You faint.  You have pain in your abdomen that gets worse.  You have fluid or blood coming from your sutures.  You have shortness of breath or difficulty breathing.  You have chest pain or leg pain.  You have ongoing nausea or diarrhea. This information is not intended to replace advice given to you by your health care provider. Make sure you discuss any questions you have with your health care provider. Document Released: 01/03/2012 Document Revised: 02/28/2017 Document Reviewed: 06/14/2015 Elsevier Interactive Patient Education  2019 [employer]. What You Need to Know About Female Sterilization Female sterilization is surgery to prevent pregnancy. In this surgery, the fallopian tubes  are either blocked or closed off. This prevents eggs from reaching the uterus so that the eggs cannot be fertilized by sperm and you cannot get pregnant. Sterilization is permanent. It should only be done if you are sure that you do not want to be able to have children. What are the sterilization surgery options? There are several kinds of female sterilization surgeries. They include:  Laparoscopic tubal ligation. In this surgery,  the fallopian tubes are tied off, sealed with heat, or blocked with a clip, ring, or clamp. A small portion of each fallopian tube may also be removed. This surgery is done through several small cuts (incisions).  Postpartum tubal ligation. This is also called a mini-laparotomy. This surgery is done right after childbirth or 1 or 2 days after childbirth. In this surgery, the fallopian tubes are tied off, sealed with heat, or blocked with a clip, ring, or clamp. A small portion of each fallopian tube may also be removed. The surgery is done through a single incision.  Hysteroscopic sterilization. In this surgery, a tiny, spring-like coil is inserted through the cervix and uterus into the fallopian tubes. The coil causes scarring, which blocks the tubes. After the surgery, contraception should be used for 3 months to allow the scar tissue to form completely. Is sterilization safe? Generally, sterilization is safe. Complications are rare. However, there are risks. They include:  Bleeding.  Infection.  Reaction to medicine used during the procedure.  Injury to surrounding organs.  Failure of the procedure. How effective is sterilization? Sterilization is nearly 100% effective, but it can fail. Also, the fallopian tubes can grow back together over time. If this happens, you will be able to get pregnant again. Women who have had this procedure have a higher chance of having an ectopic pregnancy. An ectopic pregnancy is a pregnancy that happens outside of the uterus. This kind of pregnancy is unsuccessful and can lead to serious bleeding if it is not treated. What are the benefits?  It is usually effective for a lifetime.  It is usually safe.  It does not have the drawbacks of other types of birth control: That means: ? Your hormones are not affected. Because of this, your menstrual periods, sexual desire, and sexual performance will not be affected. ? There are no side effects. What are the  drawbacks?  If you change your mind and decide that you want to have children, you may not be able to. Sterilization may be reversed, but a reversal is not always successful.  It does not provide protection against STDs (sexually transmitted diseases).  It increases the chance of having an ectopic pregnancy. This information is not intended to replace advice given to you by your health care provider. Make sure you discuss any questions you have with your health care provider. Document Released: 12/21/2007 Document Revised: 02/25/2016 Document Reviewed: 03/31/2015 Elsevier Interactive Patient Education  2019 [employer]. Third Trimester of Pregnancy The third trimester is from week 28 through week 40 (months 7 through 9). The third trimester is a time when the unborn baby (fetus) is growing rapidly. At the end of the ninth month, the fetus is about 20 inches in length and weighs 6-10 pounds. Body changes during your third trimester Your body will continue to go through many changes during pregnancy. The changes vary from woman to woman. During the third trimester:  Your weight will continue to increase. You can expect to gain 25-35 pounds (11-16 kg) by the end of the pregnancy.  You may begin to get stretch marks on your hips, abdomen, and breasts.  You may urinate more often because the fetus is moving lower into your pelvis and pressing on your bladder.  You may develop or continue to have heartburn. This is caused by increased hormones that slow down muscles in the digestive tract.  You may develop or continue to have constipation because increased hormones slow digestion and cause the muscles that push waste through your intestines to relax.  You may develop hemorrhoids. These are swollen veins (varicose veins) in the rectum that can itch or be painful.  You may develop swollen, bulging veins (varicose veins) in your legs.  You may have increased body aches in the pelvis, back, or  thighs. This is due to weight gain and increased hormones that are relaxing your joints.  You may have changes in your hair. These can include thickening of your hair, rapid growth, and changes in texture. Some women also have hair loss during or after pregnancy, or hair that feels dry or thin. Your hair will most likely return to normal after your baby is born.  Your breasts will continue to grow and they will continue to become tender. A yellow fluid (colostrum) may leak from your breasts. This is the first milk you are producing for your baby.  Your belly button may stick out.  You may notice more swelling in your hands, face, or ankles.  You may have increased tingling or numbness in your hands, arms, and legs. The skin on your belly may also feel numb.  You may feel short of breath because of your expanding uterus.  You may have more problems sleeping. This can be caused by the size of your belly, increased need to urinate, and an increase in your body's metabolism.  You may notice the fetus "dropping," or moving lower in your abdomen (lightening).  You may have increased vaginal discharge.  You may notice your joints feel loose and you may have pain around your pelvic bone. What to expect at prenatal visits You will have prenatal exams every 2 weeks until week 36. Then you will have weekly prenatal exams. During a routine prenatal visit:  You will be weighed to make sure you and the baby are growing normally.  Your blood pressure will be taken.  Your abdomen will be measured to track your baby's growth.  The fetal heartbeat will be listened to.  Any test results from the previous visit will be discussed.  You may have a cervical check near your due date to see if your cervix has softened or thinned (effaced).  You will be tested for Group B streptococcus. This happens between 35 and 37 weeks. Your health care provider may ask you:  What your birth plan is.  How you are  feeling.  If you are feeling the baby move.  If you have had any abnormal symptoms, such as leaking fluid, bleeding, severe headaches, or abdominal cramping.  If you are using any tobacco products, including cigarettes, chewing tobacco, and electronic cigarettes.  If you have any questions. Other tests or screenings that may be performed during your third trimester include:  Blood tests that check for low iron levels (anemia).  Fetal testing to check the health, activity level, and growth of the fetus. Testing is done if you have certain medical conditions or if there are problems during the pregnancy.  Nonstress test (NST). This test checks the health of your baby to make sure  there are no signs of problems, such as the baby not getting enough oxygen. During this test, a belt is placed around your belly. The baby is made to move, and its heart rate is monitored during movement. What is false labor? False labor is a condition in which you feel small, irregular tightenings of the muscles in the womb (contractions) that usually go away with rest, changing position, or drinking water. These are called Braxton Hicks contractions. Contractions may last for hours, days, or even weeks before true labor sets in. If contractions come at regular intervals, become more frequent, increase in intensity, or become painful, you should see your health care provider. What are the signs of labor?  Abdominal cramps.  Regular contractions that start at 10 minutes apart and become stronger and more frequent with time.  Contractions that start on the top of the uterus and spread down to the lower abdomen and back.  Increased pelvic pressure and dull back pain.  A watery or bloody mucus discharge that comes from the vagina.  Leaking of amniotic fluid. This is also known as your "water breaking." It could be a slow trickle or a gush. Let your health care provider know if it has a color or strange odor. If you  have any of these signs, call your health care provider right away, even if it is before your due date. Follow these instructions at home: Medicines  Follow your health care provider's instructions regarding medicine use. Specific medicines may be either safe or unsafe to take during pregnancy.  Take a prenatal vitamin that contains at least 600 micrograms (mcg) of folic acid.  If you develop constipation, try taking a stool softener if your health care provider approves. Eating and drinking   Eat a balanced diet that includes fresh fruits and vegetables, whole grains, good sources of protein such as meat, eggs, or tofu, and low-fat dairy. Your health care provider will help you determine the amount of weight gain that is right for you.  Avoid raw meat and uncooked cheese. These carry germs that can cause birth defects in the baby.  If you have low calcium intake from food, talk to your health care provider about whether you should take a daily calcium supplement.  Eat four or five small meals rather than three large meals a day.  Limit foods that are high in fat and processed sugars, such as fried and sweet foods.  To prevent constipation: ? Drink enough fluid to keep your urine clear or pale yellow. ? Eat foods that are high in fiber, such as fresh fruits and vegetables, whole grains, and beans. Activity  Exercise only as directed by your health care provider. Most women can continue their usual exercise routine during pregnancy. Try to exercise for 30 minutes at least 5 days a week. Stop exercising if you experience uterine contractions.  Avoid heavy lifting.  Do not exercise in extreme heat or humidity, or at high altitudes.  Wear low-heel, comfortable shoes.  Practice good posture.  You may continue to have sex unless your health care provider tells you otherwise. Relieving pain and discomfort  Take frequent breaks and rest with your legs elevated if you have leg cramps  or low back pain.  Take warm sitz baths to soothe any pain or discomfort caused by hemorrhoids. Use hemorrhoid cream if your health care provider approves.  Wear a good support bra to prevent discomfort from breast tenderness.  If you develop varicose veins: ? Wear  support pantyhose or compression stockings as told by your healthcare provider. ? Elevate your feet for 15 minutes, 3-4 times a day. Prenatal care  Write down your questions. Take them to your prenatal visits.  Keep all your prenatal visits as told by your health care provider. This is important. Safety  Wear your seat belt at all times when driving.  Make a list of emergency phone numbers, including numbers for family, friends, the hospital, and police and fire departments. General instructions  Avoid cat litter boxes and soil used by cats. These carry germs that can cause birth defects in the baby. If you have a cat, ask someone to clean the litter box for you.  Do not travel far distances unless it is absolutely necessary and only with the approval of your health care provider.  Do not use hot tubs, steam rooms, or saunas.  Do not drink alcohol.  Do not use any products that contain nicotine or tobacco, such as cigarettes and e-cigarettes. If you need help quitting, ask your health care provider.  Do not use any medicinal herbs or unprescribed drugs. These chemicals affect the formation and growth of the baby.  Do not douche or use tampons or scented sanitary pads.  Do not cross your legs for long periods of time.  To prepare for the arrival of your baby: ? Take prenatal classes to understand, practice, and ask questions about labor and delivery. ? Make a trial run to the hospital. ? Visit the hospital and tour the maternity area. ? Arrange for maternity or paternity leave through employers. ? Arrange for family and friends to take care of pets while you are in the hospital. ? Purchase a rear-facing car seat  and make sure you know how to install it in your car. ? Pack your hospital bag. ? Prepare the baby's nursery. Make sure to remove all pillows and stuffed animals from the baby's crib to prevent suffocation.  Visit your dentist if you have not gone during your pregnancy. Use a soft toothbrush to brush your teeth and be gentle when you floss. Contact a health care provider if:  You are unsure if you are in labor or if your water has broken.  You become dizzy.  You have mild pelvic cramps, pelvic pressure, or nagging pain in your abdominal area.  You have lower back pain.  You have persistent nausea, vomiting, or diarrhea.  You have an unusual or bad smelling vaginal discharge.  You have pain when you urinate. Get help right away if:  Your water breaks before 37 weeks.  You have regular contractions less than 5 minutes apart before 37 weeks.  You have a fever.  You are leaking fluid from your vagina.  You have spotting or bleeding from your vagina.  You have severe abdominal pain or cramping.  You have rapid weight loss or weight gain.  You have shortness of breath with chest pain.  You notice sudden or extreme swelling of your face, hands, ankles, feet, or legs.  Your baby makes fewer than 10 movements in 2 hours.  You have severe headaches that do not go away when you take medicine.  You have vision changes. Summary  The third trimester is from week 28 through week 40, months 7 through 9. The third trimester is a time when the unborn baby (fetus) is growing rapidly.  During the third trimester, your discomfort may increase as you and your baby continue to gain weight. You may have  abdominal, leg, and back pain, sleeping problems, and an increased need to urinate.  During the third trimester your breasts will keep growing and they will continue to become tender. A yellow fluid (colostrum) may leak from your breasts. This is the first milk you are producing for your  baby.  False labor is a condition in which you feel small, irregular tightenings of the muscles in the womb (contractions) that eventually go away. These are called Braxton Hicks contractions. Contractions may last for hours, days, or even weeks before true labor sets in.  Signs of labor can include: abdominal cramps; regular contractions that start at 10 minutes apart and become stronger and more frequent with time; watery or bloody mucus discharge that comes from the vagina; increased pelvic pressure and dull back pain; and leaking of amniotic fluid. This information is not intended to replace advice given to you by your health care provider. Make sure you discuss any questions you have with your health care provider. Document Released: 06/28/2001 Document Revised: 08/09/2016 Document Reviewed: 08/09/2016 Elsevier Interactive Patient Education  2019 [employer].

## 2018-09-19 NOTE — Addendum Note (Signed)
Addended by: Dalphine Handing on: 09/19/2018 08:50 AM   Modules accepted: Orders

## 2018-09-20 LAB — GLUCOSE TOLERANCE, 2 HOURS W/ 1HR
Glucose, 1 hour: 166 mg/dL (ref 65–179)
Glucose, 2 hour: 124 mg/dL (ref 65–152)
Glucose, Fasting: 72 mg/dL (ref 65–91)

## 2018-09-21 LAB — RPR, QUANT+TP ABS (REFLEX)
Rapid Plasma Reagin, Quant: 1:1 {titer} — ABNORMAL HIGH
TREPONEMA PALLIDUM AB: NONREACTIVE

## 2018-09-21 LAB — CBC
Hematocrit: 36.1 % (ref 34.0–46.6)
Hemoglobin: 12 g/dL (ref 11.1–15.9)
MCH: 32.3 pg (ref 26.6–33.0)
MCHC: 33.2 g/dL (ref 31.5–35.7)
MCV: 97 fL (ref 79–97)
PLATELETS: 210 10*3/uL (ref 150–450)
RBC: 3.71 x10E6/uL — AB (ref 3.77–5.28)
RDW: 12.2 % (ref 11.7–15.4)
WBC: 11.9 10*3/uL — ABNORMAL HIGH (ref 3.4–10.8)

## 2018-09-21 LAB — RPR: RPR: REACTIVE — AB

## 2018-09-21 LAB — HIV ANTIBODY (ROUTINE TESTING W REFLEX): HIV Screen 4th Generation wRfx: NONREACTIVE

## 2018-09-25 ENCOUNTER — Ambulatory Visit (INDEPENDENT_AMBULATORY_CARE_PROVIDER_SITE_OTHER): Payer: Medicaid Other | Admitting: Obstetrics & Gynecology

## 2018-09-25 ENCOUNTER — Encounter: Payer: Self-pay | Admitting: Obstetrics & Gynecology

## 2018-09-25 DIAGNOSIS — Z3A28 28 weeks gestation of pregnancy: Secondary | ICD-10-CM

## 2018-09-25 DIAGNOSIS — R768 Other specified abnormal immunological findings in serum: Secondary | ICD-10-CM | POA: Insufficient documentation

## 2018-09-25 DIAGNOSIS — Z3483 Encounter for supervision of other normal pregnancy, third trimester: Secondary | ICD-10-CM

## 2018-09-25 DIAGNOSIS — Z3480 Encounter for supervision of other normal pregnancy, unspecified trimester: Secondary | ICD-10-CM

## 2018-09-25 NOTE — Progress Notes (Signed)
RPR: REACTIVE on 09/19/18.per lab notes by Dr.Ervin pt will need repeat RPR with titer in 1 month.  Pt wants to discuss labs

## 2018-09-25 NOTE — Progress Notes (Signed)
   PRENATAL VISIT NOTE  Subjective:  Julia Costa is a 28 y.o. [id number] at [redacted]w[redacted]d being seen today for ongoing prenatal care.  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  Patient reports no complaints.  Contractions: Not present. Vag. Bleeding: None.  Movement: Present. Denies leaking of fluid.   The following portions of the patient's history were reviewed and updated as appropriate: allergies, current medications, past family history, past medical history, past social history, past surgical history and problem list.   Objective:   Vitals:   09/25/18 1404  BP: 98/65  Pulse: 84  Weight: 69.4 kg    Fetal Status: Fetal Heart Rate (bpm): 157   Movement: Present     General:  Alert, oriented and cooperative. Patient is in no acute distress.  Skin: Skin is warm and dry. No rash noted.   Cardiovascular: Normal heart rate noted  Respiratory: Normal respiratory effort, no problems with respiration noted  Abdomen: Soft, gravid, appropriate for gestational age.  Pain/Pressure: Absent     Pelvic: Cervical exam deferred        Extremities: Normal range of motion.  Edema: None  Mental Status: Normal mood and affect. Normal behavior. Normal judgment and thought content.   Assessment and Plan:  Pregnancy: [id number] at [redacted]w[redacted]d 1. Supervision of other normal pregnancy, antepartum Normal pregnancy  2. 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 Please refer to After Visit Summary for other counseling recommendations.   Return in about 2 weeks (around 10/09/2018).  Future Appointments  Date Time Provider Department Center  10/09/2018  8:45 AM Constant, Gigi Gin, MD [facility] None    Scheryl Darter, MD

## 2018-09-25 NOTE — Patient Instructions (Signed)

## 2018-10-09 ENCOUNTER — Other Ambulatory Visit: Payer: Self-pay

## 2018-10-09 ENCOUNTER — Encounter: Payer: Self-pay | Admitting: Obstetrics and Gynecology

## 2018-10-09 ENCOUNTER — Ambulatory Visit (INDEPENDENT_AMBULATORY_CARE_PROVIDER_SITE_OTHER): Payer: Medicaid Other | Admitting: Obstetrics and Gynecology

## 2018-10-09 VITALS — BP 99/63 | HR 94 | Wt 154.1 lb

## 2018-10-09 DIAGNOSIS — O09899 Supervision of other high risk pregnancies, unspecified trimester: Secondary | ICD-10-CM

## 2018-10-09 DIAGNOSIS — O09893 Supervision of other high risk pregnancies, third trimester: Secondary | ICD-10-CM

## 2018-10-09 DIAGNOSIS — Z3A30 30 weeks gestation of pregnancy: Secondary | ICD-10-CM

## 2018-10-09 DIAGNOSIS — Z3480 Encounter for supervision of other normal pregnancy, unspecified trimester: Secondary | ICD-10-CM

## 2018-10-09 MED ORDER — FLUCONAZOLE 150 MG PO TABS: 150.0000 mg | ORAL_TABLET | Freq: Once | ORAL | 0 refills | Status: AC

## 2018-10-09 NOTE — Progress Notes (Signed)
   PRENATAL VISIT NOTE  Subjective:  Julia Costa is a 28 y.o. [id number] at [redacted]w[redacted]d being seen today for ongoing prenatal care.  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  Patient reports no complaints.  Contractions: Not present. Vag. Bleeding: None.  Movement: Present. Denies leaking of fluid.   The following portions of the patient's history were reviewed and updated as appropriate: allergies, current medications, past family history, past medical history, past social history, past surgical history and problem list.   Objective:   Vitals:   10/09/18 0856  BP: 99/63  Pulse: 94  Weight: 154 lb 1.6 oz (69.9 kg)    Fetal Status: Fetal Heart Rate (bpm): 140 Fundal Height: 31 cm Movement: Present     General:  Alert, oriented and cooperative. Patient is in no acute distress.  Skin: Skin is warm and dry. No rash noted.   Cardiovascular: Normal heart rate noted  Respiratory: Normal respiratory effort, no problems with respiration noted  Abdomen: Soft, gravid, appropriate for gestational age.  Pain/Pressure: Absent     Pelvic: Cervical exam deferred        Extremities: Normal range of motion.  Edema: None  Mental Status: Normal mood and affect. Normal behavior. Normal judgment and thought content.   Assessment and Plan:  Pregnancy: [id number] at [redacted]w[redacted]d 1. Supervision of other normal pregnancy, antepartum Patient is doing well without complaints Patient considering paraguard IUD for contraception  2. Short interval between pregnancies affecting pregnancy, antepartum   Preterm labor symptoms and general obstetric precautions including but not limited to vaginal bleeding,  contractions, leaking of fluid and fetal movement were reviewed in detail with the patient. Please refer to After Visit Summary for other counseling recommendations.   Return in about 2 weeks (around 10/23/2018) for ROB.  No future appointments.  Catalina Antigua, MD

## 2018-10-09 NOTE — Progress Notes (Signed)
Pt is here for ROB. [redacted]w[redacted]d.  

## 2018-10-23 ENCOUNTER — Other Ambulatory Visit: Payer: Self-pay

## 2018-10-23 ENCOUNTER — Ambulatory Visit (INDEPENDENT_AMBULATORY_CARE_PROVIDER_SITE_OTHER): Payer: Medicaid Other | Admitting: Obstetrics & Gynecology

## 2018-10-23 DIAGNOSIS — Z3483 Encounter for supervision of other normal pregnancy, third trimester: Secondary | ICD-10-CM

## 2018-10-23 DIAGNOSIS — Z3480 Encounter for supervision of other normal pregnancy, unspecified trimester: Secondary | ICD-10-CM

## 2018-10-23 DIAGNOSIS — Z3A32 32 weeks gestation of pregnancy: Secondary | ICD-10-CM | POA: Diagnosis not present

## 2018-10-23 NOTE — Progress Notes (Signed)
S/w pt via tele visit. Pt reports fetal movement, denies pain.

## 2018-10-23 NOTE — Patient Instructions (Signed)

## 2018-10-23 NOTE — Progress Notes (Signed)
   TELEHEALTH VIRTUAL OBSTETRICS VISIT ENCOUNTER NOTE  I connected with Julia Costa on 10/23/18 at 10:00 AM EDT by telephone at home and verified that I am speaking with the correct person using two identifiers.   I discussed the limitations, risks, security and privacy concerns of performing an evaluation and management service by telephone and the availability of in person appointments. I also discussed with the patient that there may be a patient responsible charge related to this service. The patient expressed understanding and agreed to proceed.  Subjective:  Julia Costa is a 28 y.o. [id number] at [redacted]w[redacted]d being followed for ongoing prenatal care.  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  Patient reports she had a headache last week, fully resolved. Reports fetal movement. Denies any contractions, bleeding or leaking of fluid.   The following portions of the patient's history were reviewed and updated as appropriate: allergies, current medications, past family history, past medical history, past social history, past surgical history and problem list.   Objective:   General:  Alert, oriented and cooperative.   Mental Status: Normal mood and affect perceived. Normal judgment and thought content.  Rest of physical exam deferred due to type of encounter  Assessment and Plan:  Pregnancy: [id number] at [redacted]w[redacted]d 1. Supervision of other normal pregnancy, antepartum Doing well, will pick up a BP cuff and start reporting  Preterm labor symptoms and general obstetric precautions including but not limited to vaginal bleeding, contractions, leaking of fluid and fetal movement were  reviewed in detail with the patient.  I discussed the assessment and treatment plan with the patient. The patient was provided an opportunity to ask questions and all were answered. The patient agreed with the plan and demonstrated an understanding of the instructions. The patient was advised to call back or seek an in-person office evaluation/go to MAU at [facility] for any urgent or concerning symptoms. Please refer to After Visit Summary for other counseling recommendations.   I provided 10 minutes of non-face-to-face time during this encounter.  Return in about 2 weeks (around 11/06/2018).  No future appointments.  Scheryl Darter, MD Center for [facility], [facility]

## 2018-11-06 ENCOUNTER — Encounter: Payer: Self-pay | Admitting: Obstetrics

## 2018-11-06 ENCOUNTER — Other Ambulatory Visit: Payer: Self-pay

## 2018-11-06 ENCOUNTER — Ambulatory Visit (INDEPENDENT_AMBULATORY_CARE_PROVIDER_SITE_OTHER): Payer: Medicaid Other | Admitting: Obstetrics

## 2018-11-06 DIAGNOSIS — R768 Other specified abnormal immunological findings in serum: Secondary | ICD-10-CM

## 2018-11-06 DIAGNOSIS — Z3483 Encounter for supervision of other normal pregnancy, third trimester: Secondary | ICD-10-CM

## 2018-11-06 DIAGNOSIS — Z3A34 34 weeks gestation of pregnancy: Secondary | ICD-10-CM | POA: Diagnosis not present

## 2018-11-06 DIAGNOSIS — Z3480 Encounter for supervision of other normal pregnancy, unspecified trimester: Secondary | ICD-10-CM

## 2018-11-06 NOTE — Progress Notes (Signed)
   TELEHEALTH VIRTUAL OBSTETRICS VISIT ENCOUNTER NOTE  I connected with Lynnea Ferrier on 11/06/18 at  1:45 PM EDT by telephone at home and verified that I am speaking with the correct person using two identifiers.   I discussed the limitations, risks, security and privacy concerns of performing an evaluation and management service by telephone and the availability of in person appointments. I also discussed with the patient that there may be a patient responsible charge related to this service. The patient expressed understanding and agreed to proceed.  Subjective:  Junetta Kamada is a 28 y.o. K3T4656 at [redacted]w[redacted]d being followed for ongoing prenatal care.  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  Patient reports no complaints. Reports fetal movement. Denies any contractions, bleeding or leaking of fluid.   The following portions of the patient's history were reviewed and updated as appropriate: allergies, current medications, past family history, past medical history, past social history, past surgical history and problem list.   Objective:   General:  Alert, oriented and cooperative.   Mental Status: Normal mood and affect perceived. Normal judgment and thought content.  Rest of physical exam deferred due to type of encounter  Assessment and Plan:  Pregnancy: [id number] at [redacted]w[redacted]d 1. Supervision of other normal pregnancy, antepartum  2. 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  I  discussed the assessment and treatment plan with the patient. The patient was provided an opportunity to ask questions and all were answered. The patient agreed with the plan and demonstrated an understanding of the instructions. The patient was advised to call back or seek an in-person office evaluation/go to MAU at [facility] for any urgent or concerning symptoms. Please refer to After Visit Summary for other counseling recommendations.   I provided 8 minutes of non-face-to-face time during this encounter.  Return in about 2 weeks (around 11/20/2018) for ROB.  GBS.[name]    Coral Ceo, MD Center for [facility], [facility] 11-06-2018

## 2018-11-06 NOTE — Progress Notes (Signed)
Pt is on the phone for televisit. [redacted]w[redacted]d. Pt states she has BP cuff but cannot figure out how to work the app.

## 2018-11-20 ENCOUNTER — Other Ambulatory Visit (HOSPITAL_COMMUNITY)
Admission: RE | Admit: 2018-11-20 | Discharge: 2018-11-20 | Disposition: A | Discharge status: Home / Self Care | Payer: Medicaid Other | Source: Ambulatory Visit | Attending: Obstetrics and Gynecology | Admitting: Obstetrics and Gynecology

## 2018-11-20 ENCOUNTER — Ambulatory Visit (INDEPENDENT_AMBULATORY_CARE_PROVIDER_SITE_OTHER): Payer: Medicaid Other | Admitting: Obstetrics and Gynecology

## 2018-11-20 ENCOUNTER — Encounter: Payer: Self-pay | Admitting: Obstetrics and Gynecology

## 2018-11-20 ENCOUNTER — Other Ambulatory Visit: Payer: Self-pay

## 2018-11-20 DIAGNOSIS — Z3480 Encounter for supervision of other normal pregnancy, unspecified trimester: Secondary | ICD-10-CM | POA: Insufficient documentation

## 2018-11-20 DIAGNOSIS — Z3A36 36 weeks gestation of pregnancy: Secondary | ICD-10-CM

## 2018-11-20 NOTE — Patient Instructions (Signed)

## 2018-11-20 NOTE — Progress Notes (Signed)
Subjective:  Julia Costa is a 28 y.o. P5V7482 at [redacted]w[redacted]d being seen today for ongoing prenatal care.  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  Patient reports general discomforts of pregnancy.  Contractions: Irregular. Vag. Bleeding: None.  Movement: Present. Denies leaking of fluid.   The following portions of the patient's history were reviewed and updated as appropriate: allergies, current medications, past family history, past medical history, past social history, past surgical history and problem list. Problem list updated.  Objective:   Vitals:   11/20/18 0948  BP: 106/71  Pulse: 82  Weight: 159 lb (72.1 kg)    Fetal Status: Fetal Heart Rate (bpm): 145   Movement: Present     General:  Alert, oriented and cooperative. Patient is in no acute distress.  Skin: Skin is warm and dry. No rash noted.   Cardiovascular: Normal heart rate noted  Respiratory: Normal respiratory effort, no problems with respiration noted  Abdomen: Soft, gravid, appropriate for gestational age. Pain/Pressure: Present     Pelvic:  Cervical exam performed        Extremities: Normal range of motion.  Edema: None  Mental Status: Normal mood and affect. Normal behavior. Normal judgment and thought content.   Urinalysis:      Assessment and Plan:  Pregnancy: [id number] at [redacted]w[redacted]d  1. Supervision of other normal pregnancy, antepartum Stable Labor precautions - Strep Gp B NAA - Cervicovaginal ancillary only( [place])  Preterm labor symptoms and general obstetric precautions including but not limited to vaginal bleeding, contractions, leaking of fluid and fetal movement were reviewed in detail  with the patient. Please refer to After Visit Summary for other counseling recommendations.  Return in about 2 weeks (around 12/04/2018) for OB visit, televisit.   Hermina Staggers, MD

## 2018-11-20 NOTE — Progress Notes (Signed)
Pt is here for ROB. [redacted]w[redacted]d.  

## 2018-11-21 LAB — CERVICOVAGINAL ANCILLARY ONLY
Chlamydia: NEGATIVE
Neisseria Gonorrhea: NEGATIVE

## 2018-11-22 LAB — STREP GP B NAA: Strep Gp B NAA: NEGATIVE

## 2018-11-28 ENCOUNTER — Encounter (HOSPITAL_COMMUNITY): Payer: Self-pay

## 2018-11-28 ENCOUNTER — Other Ambulatory Visit: Payer: Self-pay

## 2018-11-28 ENCOUNTER — Inpatient Hospital Stay (HOSPITAL_COMMUNITY)
Admission: AD | Admit: 2018-11-28 | Discharge: 2018-11-28 | Disposition: A | Discharge status: Home / Self Care | Payer: Medicaid Other | Attending: Obstetrics and Gynecology | Admitting: Obstetrics and Gynecology

## 2018-11-28 DIAGNOSIS — Z79899 Other long term (current) drug therapy: Secondary | ICD-10-CM | POA: Diagnosis not present

## 2018-11-28 DIAGNOSIS — N898 Other specified noninflammatory disorders of vagina: Secondary | ICD-10-CM | POA: Diagnosis not present

## 2018-11-28 DIAGNOSIS — Z3689 Encounter for other specified antenatal screening: Secondary | ICD-10-CM | POA: Diagnosis not present

## 2018-11-28 DIAGNOSIS — O26893 Other specified pregnancy related conditions, third trimester: Secondary | ICD-10-CM | POA: Insufficient documentation

## 2018-11-28 DIAGNOSIS — R109 Unspecified abdominal pain: Secondary | ICD-10-CM | POA: Diagnosis not present

## 2018-11-28 DIAGNOSIS — Z3A37 37 weeks gestation of pregnancy: Secondary | ICD-10-CM | POA: Insufficient documentation

## 2018-11-28 LAB — POCT FERN TEST: POCT Fern Test: NEGATIVE

## 2018-11-28 NOTE — MAU Provider Note (Signed)
History   [id number]   Chief Complaint  Patient presents with  . Labor Eval    HPI Julia FerrierLataisha Moore is a 28 y.o. female  [id number] @37 .5 wks here with report of leaking clear fluid today.  Did not have gush but underwear has been wet. Pt reports irregular contractions. She denies vaginal bleeding. Last intercourse was this am. She reports good fetal movement. All other systems negative.    Patient's last menstrual period was 03/09/2018 (exact date).  OB History  Gravida Para Term Preterm AB Living  7 4 4   2 4   SAB TAB Ectopic Multiple Live Births  1 1   0 4    # Outcome Date GA Lbr Len/2nd Weight Sex Delivery Anes PTL Lv  7 Current           6 Term 11/06/17 9760w6d 05:20 / 00:26 3544 g M Vag-Spont EPI  LIV     Birth Comments: WNL  5 SAB 08/04/16    U      4 Term 07/13/15 2337w3d 20:34 / 00:14 3000 g F Vag-Spont EPI  LIV  3 Term 06/26/11    F Vag-Spont   LIV  2 Term 12/29/09    F Vag-Spont   LIV  1 TAB 09/2007            History reviewed. No pertinent past medical history.  Family History  Problem Relation Age of Onset  . Alcohol abuse Neg Hx   . Anxiety disorder Neg Hx   . Arthritis Neg Hx   . Asthma Neg Hx   . Birth defects Neg Hx   . Cancer Neg Hx   . COPD Neg Hx   . Depression Neg Hx   . Diabetes Neg Hx   . Early death Neg Hx   . Drug abuse Neg Hx   . Hearing loss Neg Hx   . Heart disease Neg Hx   . Hypertension Neg Hx   . Hyperlipidemia Neg Hx   . Intellectual disability Neg Hx   . Kidney disease Neg Hx   . Learning disabilities Neg Hx   . Miscarriages / Stillbirths Neg Hx   . Obesity Neg Hx   . Stroke Neg Hx   . Vision loss Neg Hx   . Varicose Veins Neg Hx   . ADD / ADHD Neg Hx     Social History   Socioeconomic History  . Marital status: Single    Spouse name: Not on file  . Number of children: Not on file  . Years of education: Not on file  . Highest education level: Not on file  Occupational History  . Not on file  Social Needs  . Financial  resource strain: Not on file  . Food insecurity:    Worry: Not on file    Inability: Not on file  . Transportation needs:    Medical: Not on file    Non-medical: Not on file  Tobacco Use  . Smoking status: Never Smoker  . Smokeless tobacco: Never Used  Substance and Sexual Activity  . Alcohol use: No  . Drug use: No  . Sexual activity: Yes    Partners: Male    Birth control/protection: None  Lifestyle  . Physical activity:    Days per week: Not on file    Minutes per session: Not on file  . Stress: Not on file  Relationships  . Social connections:    Talks on phone: Not on file  Gets together: Not on file    Attends religious service: Not on file    Active member of club or organization: Not on file    Attends meetings of clubs or organizations: Not on file    Relationship status: Not on file  Other Topics Concern  . Not on file  Social History Narrative  . Not on file    No Known Allergies  No current facility-administered medications on file prior to encounter.    Current Outpatient Medications on File Prior to Encounter  Medication Sig Dispense Refill  . omeprazole (PRILOSEC) 20 MG capsule Take 1 capsule (20 mg total) by mouth 2 (two) times daily before a meal. 60 capsule 5  . Prenat-Fe Poly-Methfol-FA-DHA (VITAFOL ULTRA) 29-0.6-0.4-200 MG CAPS Take 1 capsule by mouth daily before breakfast. 90 capsule 4  . metroNIDAZOLE (FLAGYL) 500 MG tablet Take 1 tablet (500 mg total) by mouth 2 (two) times daily. (Patient not taking: Reported on 09/25/2018) 14 tablet 0     Review of Systems  Gastrointestinal: Positive for abdominal pain.  Genitourinary: Positive for vaginal discharge. Negative for vaginal bleeding.     Physical Exam   Vitals:   11/28/18 1459 11/28/18 1604  BP: 110/62 110/62  Pulse: 85 76  Resp: 18   Temp: 98.2 F (36.8 C)   TempSrc: Oral   SpO2: 99% 99%    Physical Exam  Nursing note and vitals reviewed. Constitutional: She is oriented to  person, place, and time. She appears well-developed and well-nourished. No distress.  HENT:  Head: Normocephalic and atraumatic.  Neck: Normal range of motion.  Cardiovascular: Normal rate.  Respiratory: Effort normal. No respiratory distress.  Genitourinary:    Genitourinary Comments: SSE: no pool, fern neg SVE: 1/60/-3, vtx   Musculoskeletal: Normal range of motion.  Neurological: She is alert and oriented to person, place, and time.  Skin: Skin is warm and dry.  Psychiatric: She has a normal mood and affect.  EFM: 125 bpm, mod variability, + accels, no decels Toco: irregular  Results for orders placed or performed during the hospital encounter of 11/28/18 (from the past 24 hour(s))  Fern Test     Status: None   Collection Time: 11/28/18  4:10 PM  Result Value Ref Range   POCT Fern Test Negative = intact amniotic membranes    MAU Course  Procedures  MDM Labs ordered and reviewed. No evidence of SROM or labor. Stable for discharge home.   Assessment and Plan   1. [redacted] weeks gestation of pregnancy   2. NST (non-stress test) reactive   3. Vaginal discharge during pregnancy in third trimester    Discharge home Follow up at [facility] as scheduled Labor precautions  Allergies as of 11/28/2018   No Known Allergies     Medication List    STOP taking these medications   metroNIDAZOLE 500 MG tablet Commonly known as:  FLAGYL     TAKE these medications   omeprazole 20 MG capsule Commonly known as:  PriLOSEC Take 1 capsule (20 mg total) by mouth 2 (two) times daily before a meal.   Vitafol Ultra 29-0.6-0.4-200 MG Caps Take 1 capsule by mouth daily before breakfast.      Donette Larry, CNM 11/28/2018 4:10 PM

## 2018-11-28 NOTE — MAU Note (Signed)
Pt presents to MAU with c/o ctx that started last night and have increased in intensity today. She has also had leaking fluid since last night, has not felt gush. Pt denies VB. +FM

## 2018-11-29 ENCOUNTER — Encounter (HOSPITAL_COMMUNITY): Payer: Self-pay | Admitting: *Deleted

## 2018-11-29 ENCOUNTER — Inpatient Hospital Stay (HOSPITAL_COMMUNITY)
Admission: AD | Admit: 2018-11-29 | Discharge: 2018-11-29 | Disposition: A | Discharge status: Home / Self Care | Payer: Medicaid Other | Attending: Obstetrics & Gynecology | Admitting: Obstetrics & Gynecology

## 2018-11-29 ENCOUNTER — Other Ambulatory Visit: Payer: Self-pay

## 2018-11-29 DIAGNOSIS — Z3A00 Weeks of gestation of pregnancy not specified: Secondary | ICD-10-CM | POA: Diagnosis not present

## 2018-11-29 DIAGNOSIS — O471 False labor at or after 37 completed weeks of gestation: Secondary | ICD-10-CM

## 2018-11-29 HISTORY — DX: Gestational (pregnancy-induced) hypertension without significant proteinuria, unspecified trimester: O13.9

## 2018-11-29 HISTORY — DX: Gastro-esophageal reflux disease without esophagitis: K21.9

## 2018-11-29 NOTE — Discharge Instructions (Signed)

## 2018-11-29 NOTE — MAU Note (Signed)
I have communicated with Donia Ast NP and reviewed vital signs:  Vitals:   11/29/18 2156 11/29/18 2323  BP: 121/70 114/72  Pulse: (!) 106 83  Resp: 18 16  Temp: 98.2 F (36.8 C)   SpO2: 99%     Vaginal exam:  Dilation: 3 Effacement (%): 70 Cervical Position: Posterior Station: Ballotable Presentation: Vertex Exam by:: Domenic Polite,   Also reviewed contraction pattern and that non-stress test is reactive.  It has been documented that patient is contracting every 10-13 minutes with  cervical change over 1 hour not indicating active labor.  Patient denies any other complaints.  Based on this report provider has given order for discharge.  A discharge order and diagnosis entered by a provider.   Labor discharge instructions reviewed with patient.

## 2018-11-30 ENCOUNTER — Telehealth: Payer: Self-pay | Admitting: *Deleted

## 2018-11-30 NOTE — Telephone Encounter (Signed)
Pt called to office stating she was seen at hospital last night and would like to know what she should do.  Attempt to return call. LM on VM making pt aware to monitor ctx- if consistent and every 3-5 min to be seen at hospital. Advised if any LOF or bleeding to be seen at hospital.   Advised she may call office with any other questions.

## 2018-12-01 ENCOUNTER — Encounter (HOSPITAL_COMMUNITY): Payer: Self-pay | Admitting: *Deleted

## 2018-12-01 ENCOUNTER — Inpatient Hospital Stay (HOSPITAL_COMMUNITY)
Admission: AD | Admit: 2018-12-01 | Discharge: 2018-12-01 | Disposition: A | Discharge status: Home / Self Care | Payer: Medicaid Other | Source: Ambulatory Visit | Attending: Obstetrics and Gynecology | Admitting: Obstetrics and Gynecology

## 2018-12-01 ENCOUNTER — Other Ambulatory Visit: Payer: Self-pay

## 2018-12-01 DIAGNOSIS — Z3A38 38 weeks gestation of pregnancy: Secondary | ICD-10-CM | POA: Diagnosis not present

## 2018-12-01 DIAGNOSIS — O471 False labor at or after 37 completed weeks of gestation: Secondary | ICD-10-CM | POA: Insufficient documentation

## 2018-12-01 DIAGNOSIS — O479 False labor, unspecified: Secondary | ICD-10-CM

## 2018-12-01 NOTE — Discharge Instructions (Signed)
Return to MAU if you develop stronger and more frequent contractions, leakage of fluid, vaginal bleeding, or notice decreased movement of your baby.

## 2018-12-01 NOTE — MAU Provider Note (Signed)
MAU RN Labor Evaluation  Patient is a 27yo [id number] at [redacted]w[redacted]d who presents to MAU for contractions for last 4-5 hours. Had been checked earlier today in MAU as well, cervix is unchanged since that time. Contractions are every 4-5 minutes. No vaginal bleeding, LOF and normal fetal movement. Patient given option to monitor here for an hour or go home and opted to go home. Fetal tracing reactive and reassuring - 120s/mod/+a/-d, uterine irritability. Encouraged to follow-up with prenatal provider. Labor precautions reviewed. Not in active labor and stable for discharge home.  Cristal Deer. Earlene Plater, DO OB/GYN Fellow

## 2018-12-01 NOTE — MAU Note (Signed)
Pt c/o ctx q 6 min. Denies any vag bleeding or leaking. Good fetal movement felt.

## 2018-12-01 NOTE — MAU Note (Signed)
Informed pt that provider wants to have me recheck her cervix in 1 hr to see if labor is progressing.. Pt stated she just wanted to go me since nothing has changed form the other day. Notified Dr. Earlene Plater of patients wishes and she is agreeable to d/c patient home now. Pt left before I could give her her d/dc instructions.

## 2018-12-03 ENCOUNTER — Other Ambulatory Visit: Payer: Self-pay

## 2018-12-03 ENCOUNTER — Inpatient Hospital Stay (EMERGENCY_DEPARTMENT_HOSPITAL)
Admission: AD | Admit: 2018-12-03 | Discharge: 2018-12-03 | Disposition: A | Discharge status: Home / Self Care | Payer: Medicaid Other | Source: Ambulatory Visit | Attending: Obstetrics and Gynecology | Admitting: Obstetrics and Gynecology

## 2018-12-03 ENCOUNTER — Encounter (HOSPITAL_COMMUNITY): Payer: Self-pay | Admitting: *Deleted

## 2018-12-03 ENCOUNTER — Inpatient Hospital Stay (EMERGENCY_DEPARTMENT_HOSPITAL)
Admission: AD | Admit: 2018-12-03 | Discharge: 2018-12-03 | Disposition: A | Discharge status: Home / Self Care | Payer: Medicaid Other | Source: Home / Self Care | Attending: Obstetrics & Gynecology | Admitting: Obstetrics & Gynecology

## 2018-12-03 DIAGNOSIS — O471 False labor at or after 37 completed weeks of gestation: Secondary | ICD-10-CM | POA: Insufficient documentation

## 2018-12-03 DIAGNOSIS — O479 False labor, unspecified: Secondary | ICD-10-CM | POA: Diagnosis not present

## 2018-12-03 DIAGNOSIS — Z3A38 38 weeks gestation of pregnancy: Secondary | ICD-10-CM

## 2018-12-03 DIAGNOSIS — Z3689 Encounter for other specified antenatal screening: Secondary | ICD-10-CM

## 2018-12-03 NOTE — MAU Provider Note (Signed)
S: Ms. Abigale Kapela is a 28 y.o. [id number] at [redacted]w[redacted]d  who presents to MAU today for labor evaluation.     Cervical exam by RN: unchanged from when she was seen on 5/16 Dilation: 3 Effacement (%): 60 Cervical Position: Posterior Station: -3 Presentation: Vertex  Fetal Monitoring: Baseline: 120 Variability: moderate  Accelerations: present Decelerations: none  Contractions: irregular   MDM Discussed patient with RN. NST reviewed. Patient requesting to go home since cervix has not changed   A: SIUP at [redacted]w[redacted]d  False labor  P: Discharge home Labor precautions and kick counts included in AVS Patient to follow-up with [facility] as scheduled  Patient may return to MAU as needed or when in labor   Sharyon Cable, CNM 12/03/2018 2:43 AM

## 2018-12-03 NOTE — Discharge Instructions (Signed)
Reasons to return to MAU:  1.  Contractions are  3-4 minutes apart or less, each last 1 minute, these have been going on for 2 hours, and you cannot walk or talk during them 2.  You have a large gush of fluid, or a trickle of fluid that will not stop and you have to wear a pad 3.  You have bleeding that is bright red, heavier than spotting--like menstrual bleeding (spotting can be normal in early labor or after a check of your cervix) 4.  You do not feel the baby moving like he/she normally does

## 2018-12-03 NOTE — Discharge Instructions (Signed)
Braxton Hicks Contractions Contractions of the uterus can occur throughout pregnancy, but they are not always a sign that you are in labor. You may have practice contractions called Braxton Hicks contractions. These false labor contractions are sometimes confused with true labor. What are Braxton Hicks contractions? Braxton Hicks contractions are tightening movements that occur in the muscles of the uterus before labor. Unlike true labor contractions, these contractions do not result in opening (dilation) and thinning of the cervix. Toward the end of pregnancy (32-34 weeks), Braxton Hicks contractions can happen more often and may become stronger. These contractions are sometimes difficult to tell apart from true labor because they can be very uncomfortable. You should not feel embarrassed if you go to the hospital with false labor. Sometimes, the only way to tell if you are in true labor is for your health care provider to look for changes in the cervix. The health care provider will do a physical exam and may monitor your contractions. If you are not in true labor, the exam should show that your cervix is not dilating and your water has not broken. If there are no other health problems associated with your pregnancy, it is completely safe for you to be sent home with false labor. You may continue to have Braxton Hicks contractions until you go into true labor. How to tell the difference between true labor and false labor True labor  Contractions last 30-70 seconds.  Contractions become very regular.  Discomfort is usually felt in the top of the uterus, and it spreads to the lower abdomen and low back.  Contractions do not go away with walking.  Contractions usually become more intense and increase in frequency.  The cervix dilates and gets thinner. False labor  Contractions are usually shorter and not as strong as true labor contractions.  Contractions are usually irregular.  Contractions  are often felt in the front of the lower abdomen and in the groin.  Contractions may go away when you walk around or change positions while lying down.  Contractions get weaker and are shorter-lasting as time goes on.  The cervix usually does not dilate or become thin. Follow these instructions at home:   Take over-the-counter and prescription medicines only as told by your health care provider.  Keep up with your usual exercises and follow other instructions from your health care provider.  Eat and drink lightly if you think you are going into labor.  If Braxton Hicks contractions are making you uncomfortable: ? Change your position from lying down or resting to walking, or change from walking to resting. ? Sit and rest in a tub of warm water. ? Drink enough fluid to keep your urine pale yellow. Dehydration may cause these contractions. ? Do slow and deep breathing several times an hour.  Keep all follow-up prenatal visits as told by your health care provider. This is important. Contact a health care provider if:  You have a fever.  You have continuous pain in your abdomen. Get help right away if:  Your contractions become stronger, more regular, and closer together.  You have fluid leaking or gushing from your vagina.  You pass blood-tinged mucus (bloody show).  You have bleeding from your vagina.  You have low back pain that you never had before.  You feel your baby's head pushing down and causing pelvic pressure.  Your baby is not moving inside you as much as it used to. Summary  Contractions that occur before labor are   called Braxton Hicks contractions, false labor, or practice contractions.  Braxton Hicks contractions are usually shorter, weaker, farther apart, and less regular than true labor contractions. True labor contractions usually become progressively stronger and regular, and they become more frequent.  Manage discomfort from Braxton Hicks contractions  by changing position, resting in a warm bath, drinking plenty of water, or practicing deep breathing. This information is not intended to replace advice given to you by your health care provider. Make sure you discuss any questions you have with your health care provider. Document Released: 11/17/2016 Document Revised: 04/18/2017 Document Reviewed: 11/17/2016 Elsevier Interactive Patient Education  2019 [employer]  

## 2018-12-03 NOTE — MAU Provider Note (Signed)
S: Ms. Julia Costa is a 28 y.o. [id number] at [redacted]w[redacted]d  who presents to MAU today for labor evaluation. Was seen yesterday for same complaint   Cervical exam by RN: minimal cervical change, yesterday was 3cm (essentially same cervical examination)  Dilation: 3.5 Effacement (%): 60 Station: -3 Presentation: Vertex Exam by:: Lauren Cox RN   Fetal Monitoring: Baseline: 130 Variability: moderate  Accelerations: present  Decelerations: none  Contractions: UI with rare UC   MDM Discussed patient with RN. NST reviewed.   A: SIUP at [redacted]w[redacted]d  False labor  P: Discharge home Labor precautions and kick counts included in AVS Patient to follow-up with [facility] as scheduled  Patient may return to MAU as needed or when in labor    Sharyon Cable, CNM 12/03/2018 11:10 PM

## 2018-12-03 NOTE — MAU Note (Signed)
Pt presents to MAU c/o ctxs. Pt states she is unable to time them because of her one year old being busy but they are close. Pt denies bleeding or LOF. +FM.

## 2018-12-03 NOTE — MAU Note (Signed)
I have communicated with Lanice Shirts CNM and reviewed vital signs: There were no vitals filed for this visit.  Vaginal exam:  Dilation: 3 Effacement (%): 60 Cervical Position: Posterior Station: -3 Presentation: Vertex,   Also reviewed contraction pattern and that non-stress test is reactive.  It has been documented that patient is contracting every 5-9 minutes with cervical changeinpast two days not indicating active labor.  Patient denies any other complaints.  Based on this report provider has given order for discharge.  A discharge order and diagnosis entered by a provider.   Labor discharge instructions reviewed with patient.

## 2018-12-04 ENCOUNTER — Inpatient Hospital Stay (HOSPITAL_COMMUNITY): Payer: Medicaid Other | Admitting: Anesthesiology

## 2018-12-04 ENCOUNTER — Other Ambulatory Visit: Payer: Self-pay

## 2018-12-04 ENCOUNTER — Encounter: Payer: Medicaid Other | Admitting: Obstetrics

## 2018-12-04 ENCOUNTER — Inpatient Hospital Stay (HOSPITAL_COMMUNITY)
Admission: AD | Admit: 2018-12-04 | Discharge: 2018-12-06 | DRG: 807 | Disposition: A | Discharge status: Home / Self Care | Payer: Medicaid Other | Attending: Family Medicine | Admitting: Family Medicine

## 2018-12-04 ENCOUNTER — Encounter (HOSPITAL_COMMUNITY): Payer: Self-pay | Admitting: *Deleted

## 2018-12-04 DIAGNOSIS — Z30430 Encounter for insertion of intrauterine contraceptive device: Secondary | ICD-10-CM

## 2018-12-04 DIAGNOSIS — O26893 Other specified pregnancy related conditions, third trimester: Secondary | ICD-10-CM | POA: Diagnosis present

## 2018-12-04 DIAGNOSIS — Z3A38 38 weeks gestation of pregnancy: Secondary | ICD-10-CM

## 2018-12-04 DIAGNOSIS — Z3009 Encounter for other general counseling and advice on contraception: Secondary | ICD-10-CM | POA: Diagnosis present

## 2018-12-04 DIAGNOSIS — O09899 Supervision of other high risk pregnancies, unspecified trimester: Secondary | ICD-10-CM

## 2018-12-04 DIAGNOSIS — R768 Other specified abnormal immunological findings in serum: Secondary | ICD-10-CM | POA: Diagnosis present

## 2018-12-04 DIAGNOSIS — Z1159 Encounter for screening for other viral diseases: Secondary | ICD-10-CM | POA: Diagnosis not present

## 2018-12-04 LAB — ABO/RH: ABO/RH(D): O POS

## 2018-12-04 LAB — RPR: RPR Ser Ql: NONREACTIVE

## 2018-12-04 LAB — CBC
HCT: 33.5 % — ABNORMAL LOW (ref 36.0–46.0)
Hemoglobin: 11.7 g/dL — ABNORMAL LOW (ref 12.0–15.0)
MCH: 33.1 pg (ref 26.0–34.0)
MCHC: 34.9 g/dL (ref 30.0–36.0)
MCV: 94.9 fL (ref 80.0–100.0)
Platelets: 186 10*3/uL (ref 150–400)
RBC: 3.53 MIL/uL — ABNORMAL LOW (ref 3.87–5.11)
RDW: 13.1 % (ref 11.5–15.5)
WBC: 10.4 10*3/uL (ref 4.0–10.5)
nRBC: 0 % (ref 0.0–0.2)

## 2018-12-04 LAB — TYPE AND SCREEN
ABO/RH(D): O POS
Antibody Screen: NEGATIVE

## 2018-12-04 LAB — SARS CORONAVIRUS 2 BY RT PCR (HOSPITAL ORDER, PERFORMED IN ~~LOC~~ HOSPITAL LAB): SARS Coronavirus 2: NEGATIVE

## 2018-12-04 MED ORDER — TERBUTALINE SULFATE 1 MG/ML IJ SOLN: 0.2500 mg | Freq: Once | INTRAMUSCULAR | Status: DC | PRN

## 2018-12-04 MED ORDER — SOD CITRATE-CITRIC ACID 500-334 MG/5ML PO SOLN: 30.0000 mL | ORAL | Status: DC | PRN

## 2018-12-04 MED ORDER — DIPHENHYDRAMINE HCL 25 MG PO CAPS: 25.0000 mg | ORAL_CAPSULE | Freq: Four times a day (QID) | ORAL | Status: DC | PRN

## 2018-12-04 MED ORDER — ONDANSETRON HCL 4 MG PO TABS: 4.0000 mg | ORAL_TABLET | ORAL | Status: DC | PRN

## 2018-12-04 MED ORDER — BENZOCAINE-MENTHOL 20-0.5 % EX AERO: 1.0000 "application " | INHALATION_SPRAY | CUTANEOUS | Status: DC | PRN

## 2018-12-04 MED ORDER — PHENYLEPHRINE 40 MCG/ML (10ML) SYRINGE FOR IV PUSH (FOR BLOOD PRESSURE SUPPORT)
80.0000 ug | PREFILLED_SYRINGE | INTRAVENOUS | Status: DC | PRN
  Filled 2018-12-04: qty 10

## 2018-12-04 MED ORDER — OXYTOCIN 40 UNITS IN NORMAL SALINE INFUSION - SIMPLE MED
2.5000 [IU]/h | INTRAVENOUS | Status: DC
  Filled 2018-12-04: qty 1000

## 2018-12-04 MED ORDER — OXYCODONE-ACETAMINOPHEN 5-325 MG PO TABS: 1.0000 | ORAL_TABLET | ORAL | Status: DC | PRN

## 2018-12-04 MED ORDER — OXYTOCIN BOLUS FROM INFUSION
500.0000 mL | Freq: Once | INTRAVENOUS | Status: AC
  Administered 2018-12-04: 500 mL via INTRAVENOUS

## 2018-12-04 MED ORDER — IBUPROFEN 600 MG PO TABS
600.0000 mg | ORAL_TABLET | Freq: Four times a day (QID) | ORAL | Status: DC
  Administered 2018-12-04 – 2018-12-06 (×5): 600 mg via ORAL
  Filled 2018-12-04 (×7): qty 1

## 2018-12-04 MED ORDER — LACTATED RINGERS IV SOLN: 500.0000 mL | Freq: Once | INTRAVENOUS | Status: DC

## 2018-12-04 MED ORDER — COCONUT OIL OIL: 1.0000 "application " | TOPICAL_OIL | Status: DC | PRN

## 2018-12-04 MED ORDER — SIMETHICONE 80 MG PO CHEW: 80.0000 mg | CHEWABLE_TABLET | ORAL | Status: DC | PRN

## 2018-12-04 MED ORDER — DIPHENHYDRAMINE HCL 50 MG/ML IJ SOLN: 12.5000 mg | INTRAMUSCULAR | Status: DC | PRN

## 2018-12-04 MED ORDER — ONDANSETRON HCL 4 MG/2ML IJ SOLN: 4.0000 mg | INTRAMUSCULAR | Status: DC | PRN

## 2018-12-04 MED ORDER — ACETAMINOPHEN 325 MG PO TABS: 650.0000 mg | ORAL_TABLET | ORAL | Status: DC | PRN

## 2018-12-04 MED ORDER — LACTATED RINGERS IV SOLN: 500.0000 mL | INTRAVENOUS | Status: DC | PRN

## 2018-12-04 MED ORDER — LACTATED RINGERS IV SOLN
500.0000 mL | Freq: Once | INTRAVENOUS | Status: AC
  Administered 2018-12-04: 500 mL via INTRAVENOUS

## 2018-12-04 MED ORDER — LEVONORGESTREL 19.5 MCG/DAY IU IUD
INTRAUTERINE_SYSTEM | Freq: Once | INTRAUTERINE | Status: AC
  Administered 2018-12-04: 1 via INTRAUTERINE
  Filled 2018-12-04: qty 1

## 2018-12-04 MED ORDER — TETANUS-DIPHTH-ACELL PERTUSSIS 5-2.5-18.5 LF-MCG/0.5 IM SUSP: 0.5000 mL | Freq: Once | INTRAMUSCULAR | Status: DC

## 2018-12-04 MED ORDER — DIBUCAINE (PERIANAL) 1 % EX OINT: 1.0000 "application " | TOPICAL_OINTMENT | CUTANEOUS | Status: DC | PRN

## 2018-12-04 MED ORDER — PHENYLEPHRINE 40 MCG/ML (10ML) SYRINGE FOR IV PUSH (FOR BLOOD PRESSURE SUPPORT): 80.0000 ug | PREFILLED_SYRINGE | INTRAVENOUS | Status: DC | PRN

## 2018-12-04 MED ORDER — ZOLPIDEM TARTRATE 5 MG PO TABS: 5.0000 mg | ORAL_TABLET | Freq: Every evening | ORAL | Status: DC | PRN

## 2018-12-04 MED ORDER — SENNOSIDES-DOCUSATE SODIUM 8.6-50 MG PO TABS
2.0000 | ORAL_TABLET | ORAL | Status: DC
  Administered 2018-12-04 – 2018-12-05 (×2): 2 via ORAL
  Filled 2018-12-04 (×2): qty 2

## 2018-12-04 MED ORDER — OXYCODONE-ACETAMINOPHEN 5-325 MG PO TABS: 2.0000 | ORAL_TABLET | ORAL | Status: DC | PRN

## 2018-12-04 MED ORDER — ONDANSETRON HCL 4 MG/2ML IJ SOLN: 4.0000 mg | Freq: Four times a day (QID) | INTRAMUSCULAR | Status: DC | PRN

## 2018-12-04 MED ORDER — EPHEDRINE 5 MG/ML INJ: 10.0000 mg | INTRAVENOUS | Status: DC | PRN

## 2018-12-04 MED ORDER — OXYCODONE HCL 5 MG PO TABS
5.0000 mg | ORAL_TABLET | ORAL | Status: DC | PRN
  Administered 2018-12-05 – 2018-12-06 (×4): 5 mg via ORAL
  Filled 2018-12-04 (×5): qty 1

## 2018-12-04 MED ORDER — SODIUM CHLORIDE (PF) 0.9 % IJ SOLN
INTRAMUSCULAR | Status: DC | PRN
  Administered 2018-12-04: 12 mL/h via EPIDURAL

## 2018-12-04 MED ORDER — ACETAMINOPHEN 325 MG PO TABS
650.0000 mg | ORAL_TABLET | ORAL | Status: DC | PRN
  Administered 2018-12-05 (×2): 650 mg via ORAL
  Filled 2018-12-04 (×2): qty 2

## 2018-12-04 MED ORDER — OXYTOCIN 40 UNITS IN NORMAL SALINE INFUSION - SIMPLE MED
1.0000 m[IU]/min | INTRAVENOUS | Status: DC
  Administered 2018-12-04: 2 m[IU]/min via INTRAVENOUS

## 2018-12-04 MED ORDER — LIDOCAINE HCL (PF) 1 % IJ SOLN: 30.0000 mL | INTRAMUSCULAR | Status: DC | PRN

## 2018-12-04 MED ORDER — LACTATED RINGERS IV SOLN
INTRAVENOUS | Status: DC
  Administered 2018-12-04 (×3): via INTRAVENOUS

## 2018-12-04 MED ORDER — FLEET ENEMA 7-19 GM/118ML RE ENEM: 1.0000 | ENEMA | RECTAL | Status: DC | PRN

## 2018-12-04 MED ORDER — PRENATAL MULTIVITAMIN CH
1.0000 | ORAL_TABLET | Freq: Every day | ORAL | Status: DC
  Administered 2018-12-05: 1 via ORAL
  Filled 2018-12-04 (×2): qty 1

## 2018-12-04 MED ORDER — WITCH HAZEL-GLYCERIN EX PADS: 1.0000 "application " | MEDICATED_PAD | CUTANEOUS | Status: DC | PRN

## 2018-12-04 MED ORDER — LIDOCAINE HCL (PF) 1 % IJ SOLN
INTRAMUSCULAR | Status: DC | PRN
  Administered 2018-12-04: 3 mL via EPIDURAL
  Administered 2018-12-04: 2 mL via EPIDURAL
  Administered 2018-12-04: 5 mL via EPIDURAL

## 2018-12-04 MED ORDER — FENTANYL-BUPIVACAINE-NACL 0.5-0.125-0.9 MG/250ML-% EP SOLN
12.0000 mL/h | EPIDURAL | Status: DC | PRN
  Filled 2018-12-04: qty 250

## 2018-12-04 NOTE — Progress Notes (Addendum)
Patient ID: Julia Costa, female   DOB: 04/05/1991, 28 y.o.   MRN: [id number]  Just got epidural placed; getting ready to start Pitocin; still leaking  BP 112/66, P 76 FHR 120s, +accels, no decels, Cat 1 Ctx irreg Cx- will be checked soon  IUP@38 .4wks Latent phase/ROM  -Will start Pit soon -Anticipate SVD -Leaning towards postplacental IUD for contraception, but will have Dr Adrian Blackwater talk to pt re BTL process so that she can make the best decision  Julia Costa CNM 12/04/2018

## 2018-12-04 NOTE — Anesthesia Preprocedure Evaluation (Signed)
Anesthesia Evaluation  Patient identified by MRN, date of birth, ID band Patient awake    Reviewed: Allergy & Precautions, NPO status , Patient's Chart, lab work & pertinent test results  Airway Mallampati: II  TM Distance: >3 FB Neck ROM: Full    Dental  (+) Teeth Intact, Dental Advisory Given   Pulmonary neg pulmonary ROS,    Pulmonary exam normal breath sounds clear to auscultation       Cardiovascular negative cardio ROS Normal cardiovascular exam Rhythm:Regular Rate:Normal     Neuro/Psych PSYCHIATRIC DISORDERS Anxiety negative neurological ROS     GI/Hepatic Neg liver ROS, GERD  ,  Endo/Other  negative endocrine ROS  Renal/GU negative Renal ROS     Musculoskeletal negative musculoskeletal ROS (+)   Abdominal   Peds  Hematology negative hematology ROS (+)   Anesthesia Other Findings Day of surgery medications reviewed with the patient.  Reproductive/Obstetrics (+) Pregnancy                             Anesthesia Physical Anesthesia Plan  ASA: II  Anesthesia Plan: Epidural   Post-op Pain Management:    Induction:   PONV Risk Score and Plan: 2 and Treatment may vary due to age or medical condition  Airway Management Planned: Natural Airway  Additional Equipment:   Intra-op Plan:   Post-operative Plan:   Informed Consent: I have reviewed the patients History and Physical, chart, labs and discussed the procedure including the risks, benefits and alternatives for the proposed anesthesia with the patient or authorized representative who has indicated his/her understanding and acceptance.     Dental advisory given  Plan Discussed with:   Anesthesia Plan Comments: (Patient identified. Risks/Benefits/Options discussed with patient including but not limited to bleeding, infection, nerve damage, paralysis, failed block, incomplete pain control, headache, blood pressure  changes, nausea, vomiting, reactions to medication both or allergic, itching and postpartum back pain. Confirmed with bedside nurse the patient's most recent platelet count. Confirmed with patient that they are not currently taking any anticoagulation, have any bleeding history or any family history of bleeding disorders. Patient expressed understanding and wished to proceed. All questions were answered. )        Anesthesia Quick Evaluation

## 2018-12-04 NOTE — Progress Notes (Signed)
Patient ID: Julia Costa, female   DOB: 1990/10/30, 28 y.o.   MRN: [id number]  Comfortable w/ epidural  VSS, afeb FHR 120s, +accels, no decels, Cat 1 Ctx q 3-5 mins with Pit at 55mu/min Cx deferred at present  IUP@term  Early active labor  Plan to check cx when ctx a little closer Anticipate SVD  Arabella Merles [facility] 12/04/2018

## 2018-12-04 NOTE — H&P (Signed)
LABOR AND DELIVERY ADMISSION HISTORY AND PHYSICAL NOTE  Julia Costa is a 28 y.o. female [phone] with IUP at [redacted]w[redacted]d by LMP presenting for SOL.  She reports positive fetal movement. She denies leakage of fluid or vaginal bleeding.  Prenatal History/Complications: PNC at [facility] Pregnancy complications:  - Hx of anxiety  - Short interval between pregnancies   Past Medical History: Past Medical History:  Diagnosis Date  . GERD (gastroesophageal reflux disease)   . Pregnancy induced hypertension    with her first pregnancy    Past Surgical History: Past Surgical History:  Procedure Laterality Date  . NO PAST SURGERIES    . termination of pregnancy      Obstetrical History: OB History    Gravida  7   Para  4   Term  4   Preterm      AB  2   Living  4     SAB  1   TAB  1   Ectopic      Multiple  0   Live Births  4           Social History: Social History   Socioeconomic History  . Marital status: Single    Spouse name: Not on file  . Number of children: Not on file  . Years of education: Not on file  . Highest education level: Not on file  Occupational History  . Not on file  Social Needs  . Financial resource strain: Not on file  . Food insecurity:    Worry: Not on file    Inability: Not on file  . Transportation needs:    Medical: Not on file    Non-medical: Not on file  Tobacco Use  . Smoking status: Never Smoker  . Smokeless tobacco: Never Used  Substance and Sexual Activity  . Alcohol use: No  . Drug use: No  . Sexual activity: Yes    Partners: Male    Birth control/protection: None  Lifestyle  . Physical activity:    Days per week: Not on file    Minutes per session: Not on file  . Stress: Not on file  Relationships  . Social connections:    Talks on phone: Not on file    Gets together: Not on file    Attends religious service: Not on file    Active member of club or organization: Not on file    Attends meetings of clubs  or organizations: Not on file    Relationship status: Not on file  Other Topics Concern  . Not on file  Social History Narrative  . Not on file    Family History: Family History  Problem Relation Age of Onset  . Alcohol abuse Neg Hx   . Anxiety disorder Neg Hx   . Arthritis Neg Hx   . Asthma Neg Hx   . Birth defects Neg Hx   . Cancer Neg Hx   . COPD Neg Hx   . Depression Neg Hx   . Diabetes Neg Hx   . Early death Neg Hx   . Drug abuse Neg Hx   . Hearing loss Neg Hx   . Heart disease Neg Hx   . Hypertension Neg Hx   . Hyperlipidemia Neg Hx   . Intellectual disability Neg Hx   . Kidney disease Neg Hx   . Learning disabilities Neg Hx   . Miscarriages / Stillbirths Neg Hx   . Obesity Neg Hx   .  Stroke Neg Hx   . Vision loss Neg Hx   . Varicose Veins Neg Hx   . ADD / ADHD Neg Hx     Allergies: No Known Allergies  Medications Prior to Admission  Medication Sig Dispense Refill Last Dose  . omeprazole (PRILOSEC) 20 MG capsule Take 1 capsule (20 mg total) by mouth 2 (two) times daily before a meal. 60 capsule 5 Past Week at Unknown time  . Prenat-Fe Poly-Methfol-FA-DHA (VITAFOL ULTRA) 29-0.6-0.4-200 MG CAPS Take 1 capsule by mouth daily before breakfast. 90 capsule 4 12/03/2018 at Unknown time     Review of Systems  All systems reviewed and negative except as stated in HPI  Physical Exam Blood pressure 117/70, pulse 77, temperature 98.1 F (36.7 Costa), temperature source Oral, resp. rate 17, height 5\' 4"  (1.626 m), weight 73 kg, last menstrual period 03/09/2018, unknown if currently breastfeeding. General appearance: alert, cooperative and no distress Lungs: clear to auscultation bilaterally Heart: regular rate and rhythm Abdomen: soft, non-tender; bowel sounds normal Extremities: No calf swelling or tenderness Presentation: cephalic Fetal monitoring: 120/moderate/+accels/ no decelerations  Uterine activity: irregular mild contractions  Dilation: 5.5 Effacement (%):  80 Station: -3 Exam by:: Steward Drone, CNM  Prenatal labs: ABO, Rh: --/--/O POS (05/19 0768) Antibody: NEG (05/19 0509) Rubella: 2.34 (12/09 1035) RPR: Reactive (03/04 0859) false positive, negative T Pallidum  HBsAg: Negative (12/09 1035)  HIV: Non Reactive (03/04 0859)  GC/Chlamydia: negative  GBS: Negative (05/05 1020)  2 hr Glucola: normal Genetic screening:  negative Anatomy US: normal  Nursing Staff Provider  Office Location  [facility] Dating   lmp=18wk u/s  Language  English Anatomy US  Neg   Flu Vaccine  Declined 06/25/18 Genetic Screen  NIPS: low risk   AFP: neg   TDaP vaccine   declined 09/19/18 Hgb A1C or  GTT Early n/a Third trimester nl 2 hour  Rhogam     LAB RESULTS   Feeding Plan Breast/Bottle Blood Type O/Positive/-- (12/09 1035) O pos  Contraception BTL- still undecided but consent is signed Antibody Negative (12/09 1035)neg  Circumcision Yes  Rubella 2.34 (12/09 1035)imm  Pediatrician  [facility] RPR Reactive (03/04 0859) neg  Support Person  [facility] HBsAg Negative (12/09 1035) neg  Prenatal Classes  HIV Non Reactive (03/04 0859)neg  BTL Consent  GBS  (For PCN allergy, check sensitivities)   VBAC Consent  Pap  neg 2019    Hgb Electro   neg    CF neg    SMA  low risk    Waterbirth  [ ]  Class [ ]  Consent [ ]  CNM visit    Prenatal Transfer Tool  Maternal Diabetes: No Genetic Screening: Normal Maternal Ultrasounds/Referrals: Normal Fetal Ultrasounds or other Referrals:  None Maternal Substance Abuse:  No Significant Maternal Medications:  None Significant Maternal Lab Results: Lab values include: Group B Strep negative  Results for orders placed or performed during the hospital encounter of 12/04/18 (from the past 24 hour(s))  CBC   Collection Time: 12/04/18  5:09 AM  Result Value Ref Range   WBC 10.4 4.0 - 10.5 K/uL   RBC 3.53 (L) 3.87 - 5.11 MIL/uL   Hemoglobin 11.7 (L) 12.0 - 15.0 g/dL   HCT 08.8 (L) 11.0 - 31.5 %   MCV 94.9  80.0 - 100.0 fL   MCH 33.1 26.0 - 34.0 pg   MCHC 34.9 30.0 - 36.0 g/dL   RDW 94.5 85.9 - 29.2 %   Platelets 186 150 -  400 K/uL   nRBC 0.0 0.0 - 0.2 %  Type and screen [facility]   Collection Time: 12/04/18  5:09 AM  Result Value Ref Range   ABO/RH(D) O POS    Antibody Screen NEG    Sample Expiration      12/07/2018,2359 Performed at [facility], [address] [place], [place] [address]     Patient Active Problem List   Diagnosis Date Noted  . Indication for care in labor or delivery 12/04/2018  . 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 Unwanted fertility 09/19/2018  . Short interval between pregnancies affecting pregnancy, antepartum 08/08/2018  . Encounter for supervision of normal pregnancy, unspecified, unspecified trimester 06/25/2018  . Anxiety disorder 01/09/2015  . Suicidal behavior     Assessment: Julia FerrierLataisha Costa is a 28 y.o. [id number] at 5758w4d here for SOL  #Labor: recheck cervix in 2 hours for assessment after membrane sweep  #Pain:Plans epidural  #FWB: Cat I #ID: GBS neg  #MOF: Formula  #MOC: BTL consent signed, patient unsure of BTL d/t being nervous of procedure  #Circ: Yes  Sharyon CableRogers, Julia Costa, CNM 12/04/2018, 8:33 AM

## 2018-12-04 NOTE — Progress Notes (Signed)
  Center for [facility] Supervising Physician Note  Patient Name: Julia Costa, female   DOB: 06/22/1991, 28 y.o.  MRN: [id number]  [id number] at [redacted]w[redacted]d  Admission Date: 12/04/2018 SOL  Complications: none  Foley Balloon: n/a Cytotec: n/a AROM: 0954 Pitocin: n/a  Antibiotics: n/a  BP (!) 114/55   Pulse 78   Temp 97.7 F (36.5 C) (Oral)   Resp 16   Ht 5\' 4"  (1.626 m)   Wt 73 kg   LMP 03/09/2018 (Exact Date)   BMI 27.64 kg/m  I/O last 3 completed shifts: In: 47.8 [I.V.:47.8] Out: -  No intake/output data recorded.  FHT:  FHR: 120s bpm, variability: moderate,  accelerations:  Present,  decelerations:  Absent Strip Category: Cat 1 UC:   regular, every 2 minutes SVE:   Dilation: 5.5 Effacement (%): 60 Station: -2 Exam by:: Philipp Deputy, CNM  Anticipated mode of delivery: vaginal  Levie Heritage, DO 12/04/2018 11:39 AM

## 2018-12-04 NOTE — Progress Notes (Signed)
Patient ID: Julia Costa, female   DOB: 21-Sep-1990, 28 y.o.   MRN: [id number]  Pt still feeling ctx, but able to eat/talk thru them; desires AROM  BP 112/63, P 77 FHR 115-120, +accels, no decels, Cat 1 Ctx irreg Cx post/5+/60/vtx -2; AROM for small clear fluid  IUP@term  Advanced cx dilation GBS neg  -Hopeful that AROM will increase labor; will reeval in 2 hrs- may need Pitocin -Anticipate SVD  Arabella Merles CNM 12/04/2018

## 2018-12-04 NOTE — Discharge Summary (Signed)
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 yo [id number] at [redacted]w[redacted]d was admitted to the hospital 12/04/2018 for induction of labor.  Indication for induction: Favorable cervix at term. After multiple labor checks in MAU, pt finally made cx change to 5cm. After some time on L&D her cx hadn't progressed further, so AROM then Pitocin were employed.  Patient had an uncomplicated labor course as follows: Membrane Rupture Time/Date: 9:54 AM ,12/04/2018   Intrapartum Procedures: Episiotomy: None [1]                                         Lacerations:  None [1]  Patient had delivery of a Viable infant.  Information for the patient's newborn:  Danese, Bernabei [[id number]]  Delivery Method: Vaginal, Spontaneous(Filed from Delivery Summary)   12/04/2018  Details of delivery can be found in separate delivery note. She had an IUD placed postplacentally. Patient had a routine postpartum course. Patient is discharged home 12/06/18.  Magnesium Sulfate recieved: No BMZ received: No  Physical exam  Vitals:   12/05/18 1457 12/05/18 1500 12/05/18 2300 12/06/18 0542  BP: (!) 93/55 107/73 118/88 108/60  Pulse: 62 64 67 61   Resp:   18 18  Temp: 97.8 F (36.6 C)  97.8 F (36.6 C) 97.8 F (36.6 C)  TempSrc: Oral  Oral Oral  SpO2:      Weight:      Height:       General: alert, cooperative and no distress Lochia: appropriate Uterine Fundus: firm Incision: N/A DVT Evaluation: No evidence of DVT seen on physical exam. No significant calf/ankle edema. Labs: Lab Results  Component Value Date   WBC 10.4 12/04/2018   HGB 11.7 (L) 12/04/2018   HCT 33.5 (L) 12/04/2018   MCV 94.9 12/04/2018   PLT 186 12/04/2018   CMP Latest Ref Rng & Units 01/08/2015  Glucose 65 - 99 mg/dL 79  BUN 6 - 20 mg/dL 7  Creatinine 9.38 - 1.82 mg/dL 9.93  Sodium 716 - 967 mmol/L 136  Potassium 3.5 - 5.1 mmol/L 3.4(L)  Chloride 101 - 111 mmol/L 102  CO2 22 - 32 mmol/L 26  Calcium 8.9 - 10.3 mg/dL 9.0  Total  Protein 6.5 - 8.1 g/dL 7.2  Total Bilirubin 0.3 - 1.2 mg/dL 0.6  Alkaline Phos 38 - 126 U/L 43  AST 15 - 41 U/L 19  ALT 14 - 54 U/L 14    Discharge instruction: per After Visit Summary and "Baby and Me Booklet".  After visit meds:  Allergies as of 12/06/2018   No Known Allergies     Medication List    TAKE these medications   omeprazole 20 MG capsule Commonly known as:  PriLOSEC Take 1 capsule (20 mg total) by mouth 2 (two) times daily before a meal.   Vitafol Ultra 29-0.6-0.4-200 MG Caps Take 1 capsule by mouth daily before breakfast.       Diet: routine diet  Activity: Advance as tolerated. Pelvic rest for 6 weeks.   Outpatient follow up:4 weeks Follow up Appt: Future Appointments  Date Time Provider Department Center  01/02/2019  3:00 PM Sharyon Cableogers, [place] C, CNM [facility] None   Follow up Visit: Follow-up Information    [facility]. Schedule an appointment as soon as possible for a visit in 4 week(s).   Specialty:  Obstetrics and Gynecology Why:  Make appointment in 4 weeks for postpartum visit and IUD string check  Contact information: [address], [address] [place] [id number] [phone]          Please schedule this patient for Postpartum visit in: 4 weeks with the following provider: Any provider For C/S patients schedule nurse incision check in weeks 2 weeks: no Low risk pregnancy complicated by: none Delivery mode:  SVD Anticipated Birth Control:  IUD- Liletta placed postplacentally PP Procedures needed: none  Schedule Integrated BH visit: no   Newborn Data: Live born female  Birth Weight: 3116 gm (6lb 13.9oz)  APGAR: 8, 9  Newborn Delivery   Birth date/time:  12/04/2018 19:09:00 Delivery type:  Vaginal, Spontaneous     Baby Feeding: Bottle and Breast Disposition:home with mother   12/06/2018 Sharyon CableVeronica C Chantea Surace, CNM

## 2018-12-04 NOTE — MAU Note (Signed)
PT SAYS SHE  LEFT AT 2300- AT HOME UC STRONGER -    VE - 3-4  CM .  DENIES HSV AND MRSA.  GBS- NEG

## 2018-12-04 NOTE — Lactation Note (Signed)
This note was copied from a baby's chart. Lactation Consultation Note  Patient Name: Julia Costa WKGSU'P Date: 12/04/2018 Reason for consult: Initial assessment;Early term 29-38.6wks  3 hours old FT female who is being partially BF and formula fed by his mother, that was her feeding choice on admission. Baby is currently on Gerber gentle. Mom is a P5 but not very experienced BF, she BF her other children between 2 weeks-1 month. She participated in the [facility] program [facility] and she's already familiar with hand expression; but doesn't feel very comfortable with it.  Offered assistance with latch but mom politely declined baby was asleep and not ready to feed yet. Reviewed formula supplementation guidelines according to baby's age in hours, mom understands the small size of baby's stomach and that they don't need a large amount of volume the first 24 hours. Reviewed normal newborn behavior, feeding cues and cluster feeding.  Feeding plan:  1. Encouraged mom to feed baby STS 8-12 times/24 hours or sooner if feeding cues are present 2. Mom will continue supplementing baby with with [employer] Barer Gentle per formula supplementation guidelines according the baby's age in hours  BF brochure, BF resources and feeding diary were reviewed. Mom reported all questions and concerns were answered, she's aware of LC OP services and will call PRN.  Maternal Data Formula Feeding for Exclusion: Yes Reason for exclusion: Mother's choice to formula and breast feed on admission Has patient been taught Hand Expression?: Yes Does the patient have breastfeeding experience prior to this delivery?: Yes  Feeding Feeding Type: Breast Fed Nipple Type: Slow - flow  LATCH Score Latch: Grasps breast easily, tongue down, lips flanged, rhythmical sucking.  Audible Swallowing: None  Type of Nipple: Everted at rest and after stimulation  Comfort (Breast/Nipple): Soft / non-tender  Hold (Positioning): No assistance  needed to correctly position infant at breast.  LATCH Score: 8  Interventions Interventions: Breast feeding basics reviewed  Lactation Tools Discussed/Used WIC Program: Yes   Consult Status Consult Status: Follow-up Date: 12/05/18 Follow-up type: In-patient    Julia Costa 12/04/2018, 11:08 PM

## 2018-12-04 NOTE — Anesthesia Procedure Notes (Signed)
Epidural Patient location during procedure: OB Start time: 12/04/2018 12:46 PM End time: 12/04/2018 12:51 PM  Staffing Anesthesiologist: Cecile Hearing, MD Performed: anesthesiologist   Preanesthetic Checklist Completed: patient identified, pre-op evaluation, timeout performed, IV checked, risks and benefits discussed and monitors and equipment checked  Epidural Patient position: sitting Prep: DuraPrep Patient monitoring: blood pressure and continuous pulse ox Approach: midline Location: L3-L4 Injection technique: LOR air  Needle:  Needle type: Tuohy  Needle gauge: 17 G Needle length: 9 cm Needle insertion depth: 5 cm Catheter size: 19 Gauge Catheter at skin depth: 10 cm Test dose: negative and Other (1% Lidocaine)  Additional Notes Patient identified.  Risk benefits discussed including failed block, incomplete pain control, headache, nerve damage, paralysis, blood pressure changes, nausea, vomiting, reactions to medication both toxic or allergic, and postpartum back pain.  Patient expressed understanding and wished to proceed.  All questions were answered.  Sterile technique used throughout procedure and epidural site dressed with sterile barrier dressing. No paresthesia or other complications noted. The patient did not experience any signs of intravascular injection such as tinnitus or metallic taste in mouth nor signs of intrathecal spread such as rapid motor block. Please see nursing notes for vital signs. Reason for block:procedure for pain

## 2018-12-05 NOTE — Progress Notes (Signed)
CSW acknowledged consult and attempted to meet with MOB. However, MOB reported she was experiencing a lot of pain and requested CSW come back later. CSW will meet with MOB at a later time.  Kendricks Reap Irwin, LCSWA  [facility] [phone]    

## 2018-12-05 NOTE — Progress Notes (Signed)
Post Partum Day 1 Subjective: no complaints, up ad lib, voiding and tolerating PO  Objective: Blood pressure 110/68, pulse 62, temperature 98.2 F (36.8 C), temperature source Oral, resp. rate 18, height 5\' 4"  (1.626 m), weight 73 kg, last menstrual period 03/09/2018, SpO2 99 %, unknown if currently breastfeeding.  Physical Exam:  General: alert, cooperative and no distress Lochia: appropriate Uterine Fundus: firm DVT Evaluation: No evidence of DVT seen on physical exam. Negative Homan's sign. No cords or calf tenderness. No significant calf/ankle edema.  Recent Labs    12/04/18 0509  HGB 11.7*  HCT 33.5*    Assessment/Plan: Plan for discharge tomorrow and Breastfeeding   LOS: 1 day   Julia Costa 12/05/2018, 8:01 AM

## 2018-12-05 NOTE — Progress Notes (Signed)
CSW attempted to meet with MOB a second time. However, MOB was sleep and requested CSW come back later.  CSW will meet with MOB at a later time.  Archie Balboa, LCSWA  [facility] [phone]

## 2018-12-05 NOTE — Lactation Note (Signed)
This note was copied from a baby's chart. Lactation Consultation Note  Patient Name: Julia Costa FBPZW'C Date: 12/05/2018   P5, Baby sleeping.  Mother is breast and formula feeding. She states baby has been spitty.  Discussed feeding cues and feeding on demand. Feed on demand approximately 8-12 times per day.        Maternal Data    Feeding    LATCH Score                   Interventions    Lactation Tools Discussed/Used     Consult Status      Hardie Pulley 12/05/2018, 3:00 PM

## 2018-12-05 NOTE — Anesthesia Postprocedure Evaluation (Signed)
Anesthesia Post Note  Patient: Julia Costa  Procedure(s) Performed: AN AD HOC LABOR EPIDURAL     Patient location during evaluation: Mother Baby Anesthesia Type: Epidural Level of consciousness: awake, awake and alert and oriented Pain management: pain level controlled Vital Signs Assessment: post-procedure vital signs reviewed and stable Respiratory status: spontaneous breathing and respiratory function stable Cardiovascular status: blood pressure returned to baseline Postop Assessment: no headache, no backache, epidural receding, patient able to bend at knees, no apparent nausea or vomiting, adequate PO intake and able to ambulate Anesthetic complications: no    Last Vitals:  Vitals:   12/05/18 0230 12/05/18 0500  BP: 102/66 110/68  Pulse: 60 62  Resp: 17 18  Temp: 36.7 C 36.8 C  SpO2: 99%     Last Pain:  Vitals:   12/05/18 1200  TempSrc:   PainSc: 0-No pain   Pain Goal: Patients Stated Pain Goal: 9 (12/04/18 0721)                 Cleda Clarks

## 2018-12-06 NOTE — Progress Notes (Signed)
CSW received consult for hx of Anxiety and Depression.  CSW met with MOB to offer support and complete assessment.    Upon entering the room CSW observed that provider was also at bedside with MOB and infant. CSW offered to return at another time however MOB requested that CSW could stay. CSW entered the room and introduced self as well as congratulated MOB on the birth of infant. CSW explained  to MOB the reason for the visit.   MOB advised MOB that she doesn't have a history of anxiety. MOB reported that she did drive herself to [facility] in 2016 and was told by the staff that she was not dealing with anxiety but was just the situation she was in regarding her relationship and being pregnant . CSW apologized to [facility] about this and suggested that anxiety is real and can effect anyone. MOB reported thatshe has not been having any anxiety and is not on meds at this time. MOB also reported that she is not in therapy and doesn't feel a need to be. MOB expressed that she has been fine and feeling fine since giving birth. MOB declined feeling SI or HI. MOB also reported feelings of safety with no concerns.   CSW provided education regarding the baby blues period vs. perinatal mood disorders, discussed treatment and gave resources for mental health follow up if concerns arise.  CSW recommends self-evaluation during the postpartum time period using the New Mom Checklist from Postpartum Progress and encouraged MOB to contact a medical professional if symptoms are noted at any time.   CSW provided review of Sudden Infant Death Syndrome (SIDS) precautions.   CSW identifies no further need for intervention and no barriers to discharge at this time.    Virgie Dad Tedford Berg, MSW, LCSW-A [facility] [place] [facility] [place] [phone]

## 2018-12-06 NOTE — Lactation Note (Addendum)
This note was copied from a baby's chart. Lactation Consultation Note  Patient Name: Julia Costa IOEVO'J Date: 12/06/2018  Infant is 40 hours old. Mom & baby are preparing to be discharged. Mom has no questions or concerns, but stated her nipples are sore & she requested cooling pads.  Comfort Gels were applied & provided w/instructions for use. Nipples were noted to be atraumatic when Comfort Gels were applied.  Mom had already been provided a hand pump and coconut oil.    Lurline Hare [facility] 12/06/2018, 11:24 AM

## 2019-01-02 ENCOUNTER — Other Ambulatory Visit: Payer: Self-pay

## 2019-01-02 ENCOUNTER — Encounter: Payer: Self-pay | Admitting: Certified Nurse Midwife

## 2019-01-02 ENCOUNTER — Ambulatory Visit (INDEPENDENT_AMBULATORY_CARE_PROVIDER_SITE_OTHER): Payer: Medicaid Other | Admitting: Certified Nurse Midwife

## 2019-01-02 DIAGNOSIS — Z1389 Encounter for screening for other disorder: Secondary | ICD-10-CM

## 2019-01-02 DIAGNOSIS — Z975 Presence of (intrauterine) contraceptive device: Secondary | ICD-10-CM

## 2019-01-02 NOTE — Patient Instructions (Signed)

## 2019-01-02 NOTE — Progress Notes (Signed)
Subjective:     Julia Costa is a 28 y.o. female who presents for a postpartum visit. She is 4 weeks postpartum following a spontaneous vaginal delivery. I have fully reviewed the prenatal and intrapartum course. The delivery was at 45 gestational weeks. Outcome: spontaneous vaginal delivery. Anesthesia: epidural. Postpartum course has been uncomplicated. Baby's course has been uncomplicated. Baby is feeding by both breast and bottle - Gerber Gentle Ease . Bleeding staining only. Bowel function is normal. Bladder function is normal. Patient is not sexually active. Contraception method is IUD placed postplacental. Postpartum depression screening: negative.  The following portions of the patient's history were reviewed and updated as appropriate: allergies, current medications, past family history, past medical history and problem list.  Review of Systems A comprehensive review of systems was negative.   Objective:    There were no vitals taken for this visit.  General:  alert, cooperative and no distress   Breasts:  negative  Lungs: clear to auscultation bilaterally  Heart:  regular rate and rhythm  Abdomen: soft, non-tender; bowel sounds normal; no masses,  no organomegaly   Vulva:  normal  Vagina: normal vagina, no discharge, exudate, lesion, or erythema. IUD strings seen.  Cervix:  retroverted  Corpus: normal  Adnexa:  normal adnexa  Rectal Exam: Not performed.         Assessment/Plan:  1. Postpartum care and examination  -Normal postpartum exam. Pap smear not done at today's visit  2. IUD (intrauterine device) in place - IUD strings in place, IUD strings cut shorter and placed in posterior fornix of vaginal canal.   Return to office in 1 year for well woman examination or as needed   Lajean Manes, CNM 01/02/19, 3:48 PM

## 2019-04-08 ENCOUNTER — Ambulatory Visit (INDEPENDENT_AMBULATORY_CARE_PROVIDER_SITE_OTHER): Payer: Medicaid Other

## 2019-04-08 ENCOUNTER — Encounter (HOSPITAL_COMMUNITY): Payer: Self-pay | Admitting: Family Medicine

## 2019-04-08 ENCOUNTER — Ambulatory Visit (HOSPITAL_COMMUNITY)
Admission: EM | Admit: 2019-04-08 | Discharge: 2019-04-08 | Disposition: A | Discharge status: Home / Self Care | Payer: Medicaid Other | Attending: Family Medicine | Admitting: Family Medicine

## 2019-04-08 ENCOUNTER — Other Ambulatory Visit: Payer: Self-pay

## 2019-04-08 DIAGNOSIS — S6991XA Unspecified injury of right wrist, hand and finger(s), initial encounter: Secondary | ICD-10-CM | POA: Diagnosis not present

## 2019-04-08 DIAGNOSIS — W19XXXA Unspecified fall, initial encounter: Secondary | ICD-10-CM | POA: Diagnosis not present

## 2019-04-08 DIAGNOSIS — S63501A Unspecified sprain of right wrist, initial encounter: Secondary | ICD-10-CM

## 2019-04-08 MED ORDER — DICLOFENAC SODIUM 75 MG PO TBEC: 75.0000 mg | DELAYED_RELEASE_TABLET | Freq: Two times a day (BID) | ORAL | 0 refills | Status: DC

## 2019-04-08 NOTE — ED Provider Notes (Signed)
[facility]    CSN: [id number] Arrival date & time: 04/08/19  1155      History   Chief Complaint Chief Complaint  Patient presents with  . Wrist Pain    HPI Julia Costa is a 28 y.o. female.   This is a 28 year old woman making her initial visit to [facility] urgent care.  She complains of right wrist pain from an injury last night when she fell on the outstretched right arm.  Today the pain is much worse and she cannot move the wrist without pain.  Patient is a Public affairs consultant at work     Past Medical History:  Diagnosis Date  . Encounter for supervision of normal pregnancy, unspecified, unspecified trimester 06/25/2018   Babyrx Op- Office supplied Nursing Staff Provider Office Location  [facility] Dating   lmp=18wk u/s Language  English Anatomy US  Neg  Flu Vaccine  Declined 06/25/18 Genetic Screen  NIPS: low risk   AFP: neg  TDaP vaccine   declined 09/19/18 Hgb A1C or  GTT Early n/a Third trimester nl 2 hour Rhogam     LAB RESULTS  Feeding Plan Breast/Bottle Blood Type O/Positive/-- (12/09 1035) O pos Contraception   . GERD (gastroesophageal reflux disease)   . Pregnancy induced hypertension    with her first pregnancy  . Short interval between pregnancies affecting pregnancy, antepartum 08/08/2018   SVD 10/2017    Patient Active Problem List   Diagnosis Date Noted  . SVD (spontaneous vaginal delivery) 12/06/2018  . 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 Unwanted fertility 09/19/2018  . Anxiety disorder 01/09/2015  . Suicidal behavior     Past Surgical History:  Procedure Laterality Date  . NO PAST SURGERIES    . termination of pregnancy      OB History    Gravida  7   Para  5   Term  5   Preterm      AB  2   Living  5     SAB  1   TAB  1   Ectopic      Multiple  0   Live Births  5            Home Medications    Prior to Admission medications   Medication Sig Start Date End Date Taking? Authorizing Provider   diclofenac (VOLTAREN) 75 MG EC tablet Take 1 tablet (75 mg total) by mouth 2 (two) times daily. 04/08/19   Elvina Sidle, MD  Prenat-Fe Poly-Methfol-FA-DHA (VITAFOL ULTRA) 29-0.6-0.4-200 MG CAPS Take 1 capsule by mouth daily before breakfast. 06/25/18   Brock Bad, MD  omeprazole (PRILOSEC) 20 MG capsule Take 1 capsule (20 mg total) by mouth 2 (two) times daily before a meal. Patient not taking: Reported on 01/02/2019 06/25/18 04/08/19  Brock Bad, MD    Family History Family History  Problem Relation Age of Onset  . Alcohol abuse Neg Hx   . Anxiety disorder Neg Hx   . Arthritis Neg Hx   . Asthma Neg Hx   . Birth defects Neg Hx   . Cancer Neg Hx   . COPD Neg Hx   . Depression Neg Hx   . Diabetes Neg Hx   . Early death Neg Hx   . Drug abuse Neg Hx   . Hearing loss Neg Hx   . Heart disease Neg Hx   . Hypertension Neg Hx   . Hyperlipidemia Neg Hx   . Intellectual disability Neg  Hx   . Kidney disease Neg Hx   . Learning disabilities Neg Hx   . Miscarriages / Stillbirths Neg Hx   . Obesity Neg Hx   . Stroke Neg Hx   . Vision loss Neg Hx   . Varicose Veins Neg Hx   . ADD / ADHD Neg Hx     Social History Social History   Tobacco Use  . Smoking status: Never Smoker  . Smokeless tobacco: Never Used  Substance Use Topics  . Alcohol use: No  . Drug use: No     Allergies   Patient has no known allergies.   Review of Systems Review of Systems  All other systems reviewed and are negative.    Physical Exam Triage Vital Signs ED Triage Vitals  Enc Vitals Group     BP      Pulse      Resp      Temp      Temp src      SpO2      Weight      Height      Head Circumference      Peak Flow      Pain Score      Pain Loc      Pain Edu?      Excl. in [place]?    No data found.  Updated Vital Signs BP 116/77 (BP Location: Right Arm)   Pulse 63   Temp 98.1 F (36.7 C) (Temporal)   Resp 16   LMP 02/16/2019   SpO2 98%    Physical Exam Vitals signs  and nursing note reviewed.  Constitutional:      General: She is not in acute distress.    Appearance: Normal appearance. She is normal weight. She is not ill-appearing.  Eyes:     Conjunctiva/sclera: Conjunctivae normal.  Neck:     Musculoskeletal: Normal range of motion and neck supple.  Cardiovascular:     Rate and Rhythm: Normal rate.  Pulmonary:     Effort: Pulmonary effort is normal.  Musculoskeletal:        General: Tenderness and signs of injury present. No swelling or deformity.     Comments: Patient has difficulty moving her right wrist in any direction.  She is tender primarily over the right ulnar styloid but also has some tenderness and pain diffusely over the dorsal wrist.  There is no ecchymosis or swelling.  Skin:    General: Skin is warm and dry.     Capillary Refill: Capillary refill takes less than 2 seconds.     Comments: There is a hyperpigmented thickened area at MCP joint of right thumb, thought to be from sucking thumb.  Neurological:     General: No focal deficit present.     Mental Status: She is alert and oriented to person, place, and time.  Psychiatric:        Mood and Affect: Mood normal.        Behavior: Behavior normal.        Thought Content: Thought content normal.        Judgment: Judgment normal.      UC Treatments / Results  Labs (all labs ordered are listed, but only abnormal results are displayed) Labs Reviewed - No data to display  EKG   Radiology No results found.  Procedures Procedures (including critical care time)  Medications Ordered in UC Medications - No data to display  Initial Impression / Assessment and Plan /  UC Course  I have reviewed the triage vital signs and the nursing notes.  Pertinent labs & imaging results that were available during my care of the patient were reviewed by me and considered in my medical decision making (see chart for details).    Final Clinical Impressions(s) / UC Diagnoses   Final  diagnoses:  Sprain of right wrist, initial encounter     Discharge Instructions     Keep the wrist splint for the next 2 days and take the medicine as prescribed.  It will make you drowsy.  You should be able to go back to work on Wednesday.    ED Prescriptions    Medication Sig Dispense Auth. Provider   diclofenac (VOLTAREN) 75 MG EC tablet Take 1 tablet (75 mg total) by mouth 2 (two) times daily. 14 tablet Elvina SidleLauenstein, Steaven Wholey, MD     I have reviewed the PDMP during this encounter.   Elvina SidleLauenstein, Margie Urbanowicz, MD 04/08/19 1255

## 2019-04-08 NOTE — ED Triage Notes (Signed)
Pt here with right wrist pain after injuring last night

## 2019-04-08 NOTE — Discharge Instructions (Signed)
Keep the wrist splint for the next 2 days and take the medicine as prescribed.  It will make you drowsy.  You should be able to go back to work on Wednesday.

## 2019-09-13 ENCOUNTER — Other Ambulatory Visit: Payer: Self-pay

## 2019-09-13 ENCOUNTER — Ambulatory Visit (HOSPITAL_COMMUNITY)
Admission: EM | Admit: 2019-09-13 | Discharge: 2019-09-13 | Disposition: A | Discharge status: Home / Self Care | Payer: Medicaid Other | Attending: Family Medicine | Admitting: Family Medicine

## 2019-09-13 ENCOUNTER — Encounter (HOSPITAL_COMMUNITY): Payer: Self-pay

## 2019-09-13 DIAGNOSIS — N898 Other specified noninflammatory disorders of vagina: Secondary | ICD-10-CM

## 2019-09-13 DIAGNOSIS — R102 Pelvic and perineal pain: Secondary | ICD-10-CM

## 2019-09-13 DIAGNOSIS — Z3202 Encounter for pregnancy test, result negative: Secondary | ICD-10-CM

## 2019-09-13 DIAGNOSIS — K219 Gastro-esophageal reflux disease without esophagitis: Secondary | ICD-10-CM

## 2019-09-13 LAB — POC URINE PREG, ED: Preg Test, Ur: NEGATIVE

## 2019-09-13 LAB — POCT URINALYSIS DIP (DEVICE)
Bilirubin Urine: NEGATIVE
Glucose, UA: NEGATIVE mg/dL
Ketones, ur: NEGATIVE mg/dL
Leukocytes,Ua: NEGATIVE
Nitrite: NEGATIVE
Protein, ur: 100 mg/dL — AB
Specific Gravity, Urine: 1.025 (ref 1.005–1.030)
Urobilinogen, UA: 2 mg/dL — ABNORMAL HIGH (ref 0.0–1.0)
pH: 7 (ref 5.0–8.0)

## 2019-09-13 LAB — POCT PREGNANCY, URINE: Preg Test, Ur: NEGATIVE

## 2019-09-13 MED ORDER — OMEPRAZOLE 20 MG PO CPDR: 20.0000 mg | DELAYED_RELEASE_CAPSULE | Freq: Every day | ORAL | 1 refills | Status: DC

## 2019-09-13 MED ORDER — METRONIDAZOLE 500 MG PO TABS: 500.0000 mg | ORAL_TABLET | Freq: Two times a day (BID) | ORAL | 0 refills | Status: DC

## 2019-09-13 NOTE — ED Triage Notes (Signed)
Pt presents with vaginal odor and pelvic cramping for past weeks.  Pt also wants to check for pregnancy; pt states her cycle skipped around and she has had some intermittent nausea & vomiting.

## 2019-09-13 NOTE — Discharge Instructions (Addendum)
I was able to view the IUD strings.  I do not believe that any of your problems are due to that. You did have some discharged we will go ahead and treat for bacterial infection which may be causing your symptoms I believe that the vomiting is due to acid reflux.  Recommend taking omeprazole daily for the next couple weeks.  Avoid spicy, greasy, fried foods.  Caffeine and chocolate could also be a trigger. Take the medication 30 to 60 minutes before a meal with a glass of water.  Preferably in the morning We will do this for a few weeks.

## 2019-09-14 NOTE — ED Provider Notes (Addendum)
[facility]    CSN: [id number] Arrival date & time: 09/13/19  1146      History   Chief Complaint Chief Complaint  Patient presents with  . Vaginitis  . Possible Pregnancy    HPI Julia Costa is a 29 y.o. female.   Patient is a 29 year old female that presents today with vaginal discharge, odor and intermittent abdominal cramping for the past week or so.  Reporting similar previous infections with bacterial vaginosis.  She is currently sexually active with one partner, unprotected.  Uses IUD for birth control although she is still concerned for pregnancy.  She has had irregular menstrual cycles and some spotting with intermittent nausea and vomiting.  Reporting nausea and vomiting is most the time after she eats a meal.  Reporting burning in her chest and epigastric discomfort.  This is an intermittent problem for her.  Does admit to a diet of spicy, greasy, fried foods.  Sometimes the vomiting comes even after drinking water.  No fever, dysuria, hematuria or urinary frequency.  ROS per HPI    Possible Pregnancy    Past Medical History:  Diagnosis Date  . Encounter for supervision of normal pregnancy, unspecified, unspecified trimester 06/25/2018   Babyrx Op- Office supplied Nursing Staff Provider Office Location  [facility] Dating   lmp=18wk u/s Language  English Anatomy US  Neg  Flu Vaccine  Declined 06/25/18 Genetic Screen  NIPS: low risk   AFP: neg  TDaP vaccine   declined 09/19/18 Hgb A1C or  GTT Early n/a Third trimester nl 2 hour Rhogam     LAB RESULTS  Feeding Plan Breast/Bottle Blood Type O/Positive/-- (12/09 1035) O pos Contraception   . GERD (gastroesophageal reflux disease)   . Pregnancy induced hypertension    with her first pregnancy  . Short interval between pregnancies affecting pregnancy, antepartum 08/08/2018   SVD 10/2017    Patient Active Problem List   Diagnosis Date Noted  . SVD (spontaneous vaginal delivery) 12/06/2018  . False positive  serological syphilis test 09/25/2018  . Unwanted fertility 09/19/2018  . Anxiety disorder 01/09/2015  . Suicidal behavior     Past Surgical History:  Procedure Laterality Date  . NO PAST SURGERIES    . termination of pregnancy      OB History    Gravida  7   Para  5   Term  5   Preterm      AB  2   Living  5     SAB  1   TAB  1   Ectopic      Multiple  0   Live Births  5            Home Medications    Prior to Admission medications   Medication Sig Start Date End Date Taking? Authorizing Provider  diclofenac (VOLTAREN) 75 MG EC tablet Take 1 tablet (75 mg total) by mouth 2 (two) times daily. 04/08/19   Elvina Sidle, MD  metroNIDAZOLE (FLAGYL) 500 MG tablet Take 1 tablet (500 mg total) by mouth 2 (two) times daily. 09/13/19   Dahlia Byes A, NP  omeprazole (PRILOSEC) 20 MG capsule Take 1 capsule (20 mg total) by mouth daily. 09/13/19   Janace Aris, NP  Prenat-Fe Poly-Methfol-FA-DHA (VITAFOL ULTRA) 29-0.6-0.4-200 MG CAPS Take 1 capsule by mouth daily before breakfast. 06/25/18   Brock Bad, MD    Family History Family History  Problem Relation Age of Onset  . Alcohol abuse Neg Hx   .  Anxiety disorder Neg Hx   . Arthritis Neg Hx   . Asthma Neg Hx   . Birth defects Neg Hx   . Cancer Neg Hx   . COPD Neg Hx   . Depression Neg Hx   . Diabetes Neg Hx   . Early death Neg Hx   . Drug abuse Neg Hx   . Hearing loss Neg Hx   . Heart disease Neg Hx   . Hypertension Neg Hx   . Hyperlipidemia Neg Hx   . Intellectual disability Neg Hx   . Kidney disease Neg Hx   . Learning disabilities Neg Hx   . Miscarriages / Stillbirths Neg Hx   . Obesity Neg Hx   . Stroke Neg Hx   . Vision loss Neg Hx   . Varicose Veins Neg Hx   . ADD / ADHD Neg Hx     Social History Social History   Tobacco Use  . Smoking status: Never Smoker  . Smokeless tobacco: Never Used  Substance Use Topics  . Alcohol use: No  . Drug use: No     Allergies   Patient has no  known allergies.   Review of Systems Review of Systems   Physical Exam Triage Vital Signs ED Triage Vitals  Enc Vitals Group     BP 09/13/19 1223 125/83     Pulse Rate 09/13/19 1223 90     Resp 09/13/19 1223 18     Temp 09/13/19 1223 98.3 F (36.8 C)     Temp Source 09/13/19 1223 Oral     SpO2 09/13/19 1223 97 %     Weight --      Height --      Head Circumference --      Peak Flow --      Pain Score 09/13/19 1224 5     Pain Loc --      Pain Edu? --      Excl. in [place]? --    No data found.  Updated Vital Signs BP 125/83 (BP Location: Left Arm)   Pulse 90   Temp 98.3 F (36.8 C) (Oral)   Resp 18   SpO2 97%   Visual Acuity Right Eye Distance:   Left Eye Distance:   Bilateral Distance:    Right Eye Near:   Left Eye Near:    Bilateral Near:     Physical Exam Vitals and nursing note reviewed.  Constitutional:      General: She is not in acute distress.    Appearance: Normal appearance. She is not ill-appearing, toxic-appearing or diaphoretic.  HENT:     Head: Normocephalic.     Nose: Nose normal.  Eyes:     Conjunctiva/sclera: Conjunctivae normal.  Pulmonary:     Effort: Pulmonary effort is normal.  Abdominal:     Palpations: Abdomen is soft.     Tenderness: There is no abdominal tenderness.  Genitourinary:    Comments: External vaginal exam normal without lesions, erythema or swelling.  No discharge externally.  White, milky discharge and vaginal vault No cervical motion tenderness or cervical friability. Musculoskeletal:        General: Normal range of motion.     Cervical back: Normal range of motion.  Skin:    General: Skin is warm and dry.     Findings: No rash.  Neurological:     Mental Status: She is alert.  Psychiatric:        Mood and Affect: Mood normal.  UC Treatments / Results  Labs (all labs ordered are listed, but only abnormal results are displayed) Labs Reviewed  POCT URINALYSIS DIP (DEVICE) - Abnormal; Notable for the  following components:      Result Value   Hgb urine dipstick LARGE (*)    Protein, ur 100 (*)    Urobilinogen, UA 2.0 (*)    All other components within normal limits  POC URINE PREG, ED  POCT PREGNANCY, URINE  CERVICOVAGINAL ANCILLARY ONLY    EKG   Radiology No results found.  Procedures Procedures (including critical care time)  Medications Ordered in UC Medications - No data to display  Initial Impression / Assessment and Plan / UC Course  I have reviewed the triage vital signs and the nursing notes.  Pertinent labs & imaging results that were available during my care of the patient were reviewed by me and considered in my medical decision making (see chart for details).     Pelvic cramping and vaginal discharge.-Pelvic exam done with IUD strings visible.  No specific odor but there was discharge present.  No cervical motion tenderness upon exam cervix appears normal. We will go ahead and treat for BV based on symptoms and history.  Will send swab for testing Urine negative for pregnancy or infection  GERD-treating with omeprazole Lifestyle precautions and diet instructions given on things to avoid. Final Clinical Impressions(s) / UC Diagnoses   Final diagnoses:  Pelvic cramping  Vaginal discharge  Gastroesophageal reflux disease without esophagitis     Discharge Instructions     I was able to view the IUD strings.  I do not believe that any of your problems are due to that. You did have some discharged we will go ahead and treat for bacterial infection which may be causing your symptoms I believe that the vomiting is due to acid reflux.  Recommend taking omeprazole daily for the next couple weeks.  Avoid spicy, greasy, fried foods.  Caffeine and chocolate could also be a trigger. Take the medication 30 to 60 minutes before a meal with a glass of water.  Preferably in the morning We will do this for a few weeks.    ED Prescriptions    Medication Sig Dispense  Auth. Provider   omeprazole (PRILOSEC) 20 MG capsule Take 1 capsule (20 mg total) by mouth daily. 30 capsule Suhas Estis A, NP   metroNIDAZOLE (FLAGYL) 500 MG tablet Take 1 tablet (500 mg total) by mouth 2 (two) times daily. 14 tablet Cathaleen Korol A, NP     PDMP not reviewed this encounter.   Janace Aris, NP 09/14/19 1613    Janace Aris, NP 09/14/19 1615

## 2019-09-17 LAB — CERVICOVAGINAL ANCILLARY ONLY
Bacterial vaginitis: POSITIVE — AB
Candida vaginitis: NEGATIVE
Chlamydia: NEGATIVE
Neisseria Gonorrhea: NEGATIVE
Trichomonas: NEGATIVE

## 2019-10-24 ENCOUNTER — Ambulatory Visit: Payer: Medicaid Other | Admitting: Obstetrics & Gynecology

## 2019-10-31 ENCOUNTER — Ambulatory Visit (HOSPITAL_COMMUNITY)
Admission: EM | Admit: 2019-10-31 | Discharge: 2019-10-31 | Disposition: A | Discharge status: Home / Self Care | Payer: Medicaid Other | Attending: Physician Assistant | Admitting: Physician Assistant

## 2019-10-31 ENCOUNTER — Other Ambulatory Visit: Payer: Self-pay

## 2019-10-31 DIAGNOSIS — K0889 Other specified disorders of teeth and supporting structures: Secondary | ICD-10-CM

## 2019-10-31 DIAGNOSIS — K047 Periapical abscess without sinus: Secondary | ICD-10-CM | POA: Diagnosis not present

## 2019-10-31 MED ORDER — IBUPROFEN 800 MG PO TABS: 800.0000 mg | ORAL_TABLET | Freq: Three times a day (TID) | ORAL | 0 refills | Status: DC

## 2019-10-31 MED ORDER — CHLORHEXIDINE GLUCONATE 0.12 % MT SOLN: 15.0000 mL | Freq: Two times a day (BID) | OROMUCOSAL | 0 refills | Status: DC

## 2019-10-31 MED ORDER — AMOXICILLIN-POT CLAVULANATE 875-125 MG PO TABS: 1.0000 | ORAL_TABLET | Freq: Two times a day (BID) | ORAL | 0 refills | Status: AC

## 2019-10-31 NOTE — ED Provider Notes (Signed)
[place]    CSN: [id number] Arrival date & time: 10/31/19  1425      History   Chief Complaint No chief complaint on file.   HPI Julia Costa is a 29 y.o. female.   Patient presents for evaluation of dental pain.  She reports pain has been getting worse over the last 5 to 6 days.  She reports the pain is worse on the lower part of her jaw however does have upper tooth pain as well.  She reports issue with her back right teeth for some time.  She reports last year she had a lot of tooth pain and treated this with some home remedies and it went away.  However this time she has tried home remedies and other over-the-counter medications and they have not helped.  She has had some gum swelling.  Denies significant discharge.  Denies significant sore throat or difficulty swallowing.  Denies fever or chills.  She does not have current dental care and has not seen a dentist for some time     Past Medical History:  Diagnosis Date  . Encounter for supervision of normal pregnancy, unspecified, unspecified trimester 06/25/2018   [facility] Op- Office supplied Nursing Staff Provider Office Location  [facility] Dating   lmp=18wk u/s Language  English Anatomy US  Neg  Flu Vaccine  Declined 06/25/18 Genetic Screen  NIPS: low risk   AFP: neg  TDaP vaccine   declined 09/19/18 Hgb A1C or  GTT Early n/a Third trimester nl 2 hour Rhogam     LAB RESULTS  Feeding Plan Breast/Bottle Blood Type O/Positive/-- (12/09 1035) O pos Contraception   . GERD (gastroesophageal reflux disease)   . Pregnancy induced hypertension    with her first pregnancy  . Short interval between pregnancies affecting pregnancy, antepartum 08/08/2018   SVD 10/2017    Patient Active Problem List   Diagnosis Date Noted  . SVD (spontaneous vaginal delivery) 12/06/2018  . 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 Unwanted fertility 09/19/2018  . Anxiety disorder 01/09/2015  . Suicidal behavior     Past  Surgical History:  Procedure Laterality Date  . NO PAST SURGERIES    . termination of pregnancy      OB History    Gravida  7   Para  5   Term  5   Preterm      AB  2   Living  5     SAB  1   TAB  1   Ectopic      Multiple  0   Live Births  5            Home Medications    Prior to Admission medications   Medication Sig Start Date End Date Taking? Authorizing Provider  amoxicillin-clavulanate (AUGMENTIN) 875-125 MG tablet Take 1 tablet by mouth 2 (two) times daily for 10 days. 10/31/19 11/10/19  Tarun Patchell, Marguerita Beards, PA-C  chlorhexidine (PERIDEX) 0.12 % solution Use as directed 15 mLs in the mouth or throat 2 (two) times daily. 10/31/19   Zanyla Klebba, Marguerita Beards, PA-C  diclofenac (VOLTAREN) 75 MG EC tablet Take 1 tablet (75 mg total) by mouth 2 (two) times daily. 04/08/19   Robyn Haber, MD  ibuprofen (ADVIL) 800 MG tablet Take 1 tablet (800 mg total) by mouth 3 (three) times daily. 10/31/19   Osamah Schmader, Marguerita Beards, PA-C  metroNIDAZOLE (FLAGYL) 500 MG tablet Take 1 tablet (500 mg total) by mouth 2 (two) times daily. 09/13/19  Dahlia Byes A, NP  omeprazole (PRILOSEC) 20 MG capsule Take 1 capsule (20 mg total) by mouth daily. 09/13/19   Janace Aris, NP  Prenat-Fe Poly-Methfol-FA-DHA (VITAFOL ULTRA) 29-0.6-0.4-200 MG CAPS Take 1 capsule by mouth daily before breakfast. 06/25/18   Brock Bad, MD    Family History Family History  Problem Relation Age of Onset  . Alcohol abuse Neg Hx   . Anxiety disorder Neg Hx   . Arthritis Neg Hx   . Asthma Neg Hx   . Birth defects Neg Hx   . Cancer Neg Hx   . COPD Neg Hx   . Depression Neg Hx   . Diabetes Neg Hx   . Early death Neg Hx   . Drug abuse Neg Hx   . Hearing loss Neg Hx   . Heart disease Neg Hx   . Hypertension Neg Hx   . Hyperlipidemia Neg Hx   . Intellectual disability Neg Hx   . Kidney disease Neg Hx   . Learning disabilities Neg Hx   . Miscarriages / Stillbirths Neg Hx   . Obesity Neg Hx   . Stroke Neg Hx   . Vision  loss Neg Hx   . Varicose Veins Neg Hx   . ADD / ADHD Neg Hx     Social History Social History   Tobacco Use  . Smoking status: Never Smoker  . Smokeless tobacco: Never Used  Substance Use Topics  . Alcohol use: No  . Drug use: No     Allergies   Patient has no known allergies.   Review of Systems Review of Systems See HPI  Physical Exam Triage Vital Signs ED Triage Vitals [10/31/19 1505]  Enc Vitals Group     BP 116/87     Pulse Rate 79     Resp 16     Temp 98.4 F (36.9 C)     Temp src      SpO2 99 %     Weight      Height      Head Circumference      Peak Flow      Pain Score 10     Pain Loc      Pain Edu?      Excl. in GC?    No data found.  Updated Vital Signs BP 116/87   Pulse 79   Temp 98.4 F (36.9 C)   Resp 16   LMP 10/24/2019   SpO2 99%   Visual Acuity Right Eye Distance:   Left Eye Distance:   Bilateral Distance:    Right Eye Near:   Left Eye Near:    Bilateral Near:     Physical Exam Constitutional:      General: She is not in acute distress.    Appearance: Normal appearance. She is not ill-appearing.  HENT:     Head: Normocephalic and atraumatic.     Comments: Tenderness over right side of mandible and preauricular area.      Mouth/Throat:   Cardiovascular:     Rate and Rhythm: Normal rate.  Pulmonary:     Effort: Pulmonary effort is normal. No respiratory distress.  Musculoskeletal:     Cervical back: Normal range of motion and neck supple.  Lymphadenopathy:     Cervical: Cervical adenopathy (Superior portion of chain with shotty lymphadenopathy) present.  Neurological:     Mental Status: She is alert.      UC Treatments / Results  Labs (all  labs ordered are listed, but only abnormal results are displayed) Labs Reviewed - No data to display  EKG   Radiology No results found.  Procedures Procedures (including critical care time)  Medications Ordered in UC Medications - No data to display  Initial  Impression / Assessment and Plan / UC Course  I have reviewed the triage vital signs and the nursing notes.  Pertinent labs & imaging results that were available during my care of the patient were reviewed by me and considered in my medical decision making (see chart for details).     #Dental infection #Dental pain Patient is a 29 year old presenting with acute dental infection.  Possible early abscess formation however this could also be gingival swelling.  Will place on Augmentin with ibuprofen and acetaminophen pain management.  Peridex mouthwash prescribed.  Instructed to follow-up with dental care and resources provided.  Return precautions were discussed.   Final Clinical Impressions(s) / UC Diagnoses   Final diagnoses:  Dental infection  Pain, dental     Discharge Instructions     Please take the Augmentin twice a day for 10 days Take the ibuprofen every 8 hours with food and water Take 2 extra strength Tylenol every 8 hours Use the Peridex mouthwash.  Please follow-up with dentist.  If no improvement in 48 hours, worsening swelling, high fever or difficulty swallowing please return      ED Prescriptions    Medication Sig Dispense Auth. Provider   amoxicillin-clavulanate (AUGMENTIN) 875-125 MG tablet Take 1 tablet by mouth 2 (two) times daily for 10 days. 20 tablet Jesica Goheen, Veryl Speak, PA-C   ibuprofen (ADVIL) 800 MG tablet Take 1 tablet (800 mg total) by mouth 3 (three) times daily. 21 tablet Juno Bozard, Veryl Speak, PA-C   chlorhexidine (PERIDEX) 0.12 % solution Use as directed 15 mLs in the mouth or throat 2 (two) times daily. 120 mL Robi Dewolfe, Veryl Speak, PA-C     PDMP not reviewed this encounter.   Hermelinda Medicus, PA-C 10/31/19 1551

## 2019-10-31 NOTE — ED Triage Notes (Signed)
Pt c/o R sided dental pain since Saturday.

## 2019-10-31 NOTE — Discharge Instructions (Signed)
Please take the Augmentin twice a day for 10 days Take the ibuprofen every 8 hours with food and water Take 2 extra strength Tylenol every 8 hours Use the Peridex mouthwash.  Please follow-up with dentist.  If no improvement in 48 hours, worsening swelling, high fever or difficulty swallowing please return

## 2019-11-11 ENCOUNTER — Other Ambulatory Visit: Payer: Self-pay

## 2019-11-11 ENCOUNTER — Encounter: Payer: Self-pay | Admitting: Obstetrics and Gynecology

## 2019-11-11 ENCOUNTER — Ambulatory Visit (INDEPENDENT_AMBULATORY_CARE_PROVIDER_SITE_OTHER): Payer: Medicaid Other | Admitting: Obstetrics and Gynecology

## 2019-11-11 VITALS — BP 100/64 | HR 77 | Wt 138.3 lb

## 2019-11-11 DIAGNOSIS — Z30011 Encounter for initial prescription of contraceptive pills: Secondary | ICD-10-CM | POA: Diagnosis not present

## 2019-11-11 DIAGNOSIS — Z30432 Encounter for removal of intrauterine contraceptive device: Secondary | ICD-10-CM

## 2019-11-11 DIAGNOSIS — F329 Major depressive disorder, single episode, unspecified: Secondary | ICD-10-CM

## 2019-11-11 DIAGNOSIS — F32A Depression, unspecified: Secondary | ICD-10-CM

## 2019-11-11 MED ORDER — BALCOLTRA 0.1-20 MG-MCG(21) PO TABS: 1.0000 | ORAL_TABLET | Freq: Every day | ORAL | 4 refills | Status: DC

## 2019-11-11 NOTE — Progress Notes (Signed)
Pt is here for IUD removal, IUD Liletta placed after delivery of baby on 12/05/18. Pt requests removal due to "mood swings" she reports that have been happening since placement. Pt requests pills instead for contraception. PHQ-9 score: 15, pt denies any thoughts of self harm or suicidal thoughts.

## 2019-11-11 NOTE — Addendum Note (Signed)
Addended by: Catalina Antigua on: 11/11/2019 11:15 AM   Modules accepted: Orders

## 2019-11-11 NOTE — Progress Notes (Signed)
GYNECOLOGY CLINIC PROCEDURE NOTE  Julia Costa is a 29 y.o. [phone] here for IUD removal due to mood swings which never resolves since her delivery in 11/2018. Patient with elevated PHQ9 score and denies any symptoms of depression.  No GYN concerns.  Last pap smear was in 06/2018 and was normal.  IUD Removal  Patient identified, informed consent performed, consent signed.  Patient was in the dorsal lithotomy position, normal external genitalia was noted.  A speculum was placed in the patient's vagina, normal discharge was noted, no lesions. The cervix was visualized, no lesions, no abnormal discharge.  The strings of the IUD were grasped and pulled using ring forceps. The IUD was removed in its entirety. Patient tolerated the procedure well.    Patient will use birth control pills for contraception.  Routine preventative health maintenance measures emphasized. Patient to meet with integrated behavioral health today

## 2019-12-30 ENCOUNTER — Encounter (HOSPITAL_COMMUNITY): Payer: Self-pay | Admitting: Obstetrics & Gynecology

## 2019-12-30 ENCOUNTER — Other Ambulatory Visit: Payer: Self-pay

## 2019-12-30 ENCOUNTER — Inpatient Hospital Stay (HOSPITAL_COMMUNITY)
Admission: AD | Admit: 2019-12-30 | Discharge: 2019-12-31 | Disposition: A | Discharge status: Home / Self Care | Payer: Medicaid Other | Attending: Obstetrics & Gynecology | Admitting: Obstetrics & Gynecology

## 2019-12-30 ENCOUNTER — Inpatient Hospital Stay (HOSPITAL_COMMUNITY): Payer: Medicaid Other

## 2019-12-30 DIAGNOSIS — O0941 Supervision of pregnancy with grand multiparity, first trimester: Secondary | ICD-10-CM | POA: Diagnosis not present

## 2019-12-30 DIAGNOSIS — O3680X0 Pregnancy with inconclusive fetal viability, not applicable or unspecified: Secondary | ICD-10-CM

## 2019-12-30 DIAGNOSIS — Z3A01 Less than 8 weeks gestation of pregnancy: Secondary | ICD-10-CM | POA: Insufficient documentation

## 2019-12-30 DIAGNOSIS — O23591 Infection of other part of genital tract in pregnancy, first trimester: Secondary | ICD-10-CM | POA: Insufficient documentation

## 2019-12-30 DIAGNOSIS — B9689 Other specified bacterial agents as the cause of diseases classified elsewhere: Secondary | ICD-10-CM | POA: Diagnosis not present

## 2019-12-30 DIAGNOSIS — O26899 Other specified pregnancy related conditions, unspecified trimester: Secondary | ICD-10-CM

## 2019-12-30 DIAGNOSIS — O209 Hemorrhage in early pregnancy, unspecified: Secondary | ICD-10-CM

## 2019-12-30 DIAGNOSIS — Z6740 Type O blood, Rh positive: Secondary | ICD-10-CM | POA: Insufficient documentation

## 2019-12-30 DIAGNOSIS — R109 Unspecified abdominal pain: Secondary | ICD-10-CM | POA: Diagnosis present

## 2019-12-30 LAB — COMPREHENSIVE METABOLIC PANEL
ALT: 13 U/L (ref 0–44)
AST: 17 U/L (ref 15–41)
Albumin: 4 g/dL (ref 3.5–5.0)
Alkaline Phosphatase: 41 U/L (ref 38–126)
Anion gap: 6 (ref 5–15)
BUN: 5 mg/dL — ABNORMAL LOW (ref 6–20)
CO2: 24 mmol/L (ref 22–32)
Calcium: 9 mg/dL (ref 8.9–10.3)
Chloride: 105 mmol/L (ref 98–111)
Creatinine, Ser: 0.57 mg/dL (ref 0.44–1.00)
GFR calc Af Amer: >60 mL/min (ref >60)
GFR calc non Af Amer: >60 mL/min (ref >60)
Glucose, Bld: 91 mg/dL (ref 70–99)
Potassium: 3.4 mmol/L — ABNORMAL LOW (ref 3.5–5.1)
Sodium: 135 mmol/L (ref 135–145)
Total Bilirubin: 0.6 mg/dL (ref 0.3–1.2)
Total Protein: 6.8 g/dL (ref 6.5–8.1)

## 2019-12-30 LAB — URINALYSIS, ROUTINE W REFLEX MICROSCOPIC
Bilirubin Urine: NEGATIVE
Glucose, UA: NEGATIVE mg/dL
Ketones, ur: NEGATIVE mg/dL
Leukocytes,Ua: NEGATIVE
Nitrite: NEGATIVE
Protein, ur: NEGATIVE mg/dL
Specific Gravity, Urine: 1.006 (ref 1.005–1.030)
pH: 7 (ref 5.0–8.0)

## 2019-12-30 LAB — WET PREP, GENITAL
Trich, Wet Prep: NONE SEEN
Yeast Wet Prep HPF POC: NONE SEEN

## 2019-12-30 LAB — CBC
HCT: 38.9 % (ref 36.0–46.0)
Hemoglobin: 13.4 g/dL (ref 12.0–15.0)
MCH: 32.2 pg (ref 26.0–34.0)
MCHC: 34.4 g/dL (ref 30.0–36.0)
MCV: 93.5 fL (ref 80.0–100.0)
Platelets: 256 10*3/uL (ref 150–400)
RBC: 4.16 MIL/uL (ref 3.87–5.11)
RDW: 11.9 % (ref 11.5–15.5)
WBC: 6.5 10*3/uL (ref 4.0–10.5)
nRBC: 0 % (ref 0.0–0.2)

## 2019-12-30 LAB — POCT PREGNANCY, URINE: Preg Test, Ur: POSITIVE — AB

## 2019-12-30 LAB — HCG, QUANTITATIVE, PREGNANCY: hCG, Beta Chain, Quant, S: 4972 m[IU]/mL — ABNORMAL HIGH (ref <5)

## 2019-12-30 MED ORDER — METRONIDAZOLE 500 MG PO TABS: 500.0000 mg | ORAL_TABLET | Freq: Two times a day (BID) | ORAL | 0 refills | Status: DC

## 2019-12-30 NOTE — MAU Note (Addendum)
Pt reports to MAU stating she had a + pregnancy test last week. Pt reports she recently had her IUD removed on 12/11/18. Pt states two weeks ago she had heavy vaginal bleeding no bleeding currently. Pt reports she has had mild cramping since this weekend that she rates a 5/10. No vaginal discharge. Pt reports she felt as if she had a period 4/27- 5/1 then she bleed again 5/8-5/11.

## 2019-12-30 NOTE — Discharge Instructions (Signed)
Abdominal Pain During Pregnancy  Abdominal pain is common during pregnancy, and has many possible causes. Some causes are more serious than others, and sometimes the cause is not known. Abdominal pain can be a sign that labor is starting. It can also be caused by normal growth and stretching of muscles and ligaments during pregnancy. Always tell your health care provider if you have any abdominal pain. Follow these instructions at home:  Do not have sex or put anything in your vagina until your pain goes away completely.  Get plenty of rest until your pain improves.  Drink enough fluid to keep your urine pale yellow.  Take over-the-counter and prescription medicines only as told by your health care provider.  Keep all follow-up visits as told by your health care provider. This is important. Contact a health care provider if:  Your pain continues or gets worse after resting.  You have lower abdominal pain that: ? Comes and goes at regular intervals. ? Spreads to your back. ? Is similar to menstrual cramps.  You have pain or burning when you urinate. Get help right away if:  You have a fever or chills.  You have vaginal bleeding.  You are leaking fluid from your vagina.  You are passing tissue from your vagina.  You have vomiting or diarrhea that lasts for more than 24 hours.  Your baby is moving less than usual.  You feel very weak or faint.  You have shortness of breath.  You develop severe pain in your upper abdomen. Summary  Abdominal pain is common during pregnancy, and has many possible causes.  If you experience abdominal pain during pregnancy, tell your health care provider right away.  Follow your health care provider's home care instructions and keep all follow-up visits as directed. This information is not intended to replace advice given to you by your health care provider. Make sure you discuss any questions you have with your health care  provider. Document Revised: 10/22/2018 Document Reviewed: 10/06/2016 Elsevier Patient Education  2020 Elsevier Inc.  

## 2019-12-31 DIAGNOSIS — O3680X0 Pregnancy with inconclusive fetal viability, not applicable or unspecified: Secondary | ICD-10-CM

## 2019-12-31 NOTE — MAU Provider Note (Signed)
[place]     CSN: [id number]  Arrival date and time: 12/30/19 2200   First Provider Initiated Contact with Patient 12/30/19 2352      Chief Complaint  Patient presents with  . Abdominal Pain  . Possible Pregnancy   HPI Julia Costa is a 29 y.o. [id number] at [redacted]w[redacted]d by uncertain LMP who presents to MAU with chief complaint of abdominal cramping and vaginal spotting in setting of positive home pregnancy test. She is s/p IUD removal on 11/11/2019. Her bleeding was heavy two weeks ago then gradually resolved without intervention. Her cramping is mild to moderate and started last weekend. She rates her pain as 5/10. She denies aggravating or alleviating factors. She has not taken medication or tried other treatments for this complaint. She denies all other symptoms including abdominal tenderness, dysuria, flank pain, fever or recent illness.   OB History    Gravida  11   Para  5   Term  5   Preterm      AB  5   Living  5     SAB  1   TAB  4   Ectopic      Multiple  0   Live Births  5           Past Medical History:  Diagnosis Date  . Encounter for supervision of normal pregnancy, unspecified, unspecified trimester 06/25/2018   [facility] Op- Office supplied Nursing Staff Provider Office Location  [facility] Dating   lmp=18wk u/s Language  English Anatomy US  Neg  Flu Vaccine  Declined 06/25/18 Genetic Screen  NIPS: low risk   AFP: neg  TDaP vaccine   declined 09/19/18 Hgb A1C or  GTT Early n/a Third trimester nl 2 hour Rhogam     LAB RESULTS  Feeding Plan Breast/Bottle Blood Type O/Positive/-- (12/09 1035) O pos Contraception   . GERD (gastroesophageal reflux disease)   . Pregnancy induced hypertension    with her first pregnancy  . Short interval between pregnancies affecting pregnancy, antepartum 08/08/2018   SVD 10/2017  . SVD (spontaneous vaginal delivery) 12/06/2018  . Unwanted fertility 09/19/2018    Past Surgical History:  Procedure Laterality Date  . NO PAST SURGERIES     . termination of pregnancy      Family History  Problem Relation Age of Onset  . Alcohol abuse Neg Hx   . Anxiety disorder Neg Hx   . Arthritis Neg Hx   . Asthma Neg Hx   . Birth defects Neg Hx   . Cancer Neg Hx   . COPD Neg Hx   . Depression Neg Hx   . Diabetes Neg Hx   . Early death Neg Hx   . Drug abuse Neg Hx   . Hearing loss Neg Hx   . Heart disease Neg Hx   . Hypertension Neg Hx   . Hyperlipidemia Neg Hx   . Intellectual disability Neg Hx   . Kidney disease Neg Hx   . Learning disabilities Neg Hx   . Miscarriages / Stillbirths Neg Hx   . Obesity Neg Hx   . Stroke Neg Hx   . Vision loss Neg Hx   . Varicose Veins Neg Hx   . ADD / ADHD Neg Hx     Social History   Tobacco Use  . Smoking status: Never Smoker  . Smokeless tobacco: Never Used  Vaping Use  . Vaping Use: Never used  Substance Use Topics  . Alcohol use: No  .  Drug use: No    Allergies: No Known Allergies  No medications prior to admission.    Review of Systems  Gastrointestinal: Positive for abdominal pain.  Genitourinary: Positive for vaginal bleeding.  All other systems reviewed and are negative.  Physical Exam   Blood pressure 115/65, pulse 73, temperature 97.8 F (36.6 C), temperature source Oral, resp. rate 16, last menstrual period 11/23/2019, not currently breastfeeding.  Physical Exam  Nursing note and vitals reviewed. Cardiovascular: Normal rate, normal heart sounds and normal pulses.  Respiratory: Effort normal and breath sounds normal.  GI: Soft. There is no abdominal tenderness.  Genitourinary:    Vaginal discharge present.     Genitourinary Comments: Pelvic exam: External genitalia normal, vaginal walls pink and well rugated, cervix visually closed, no lesions noted. Thin white discharge noted at introitus and throughout vaginal vault. No bleeding or evidence of previous bleeding noted.    Neurological: She is alert.    MAU Course/MDM  Procedures: speculum exam,  formal ob ultrasound  Patient Vitals for the past 24 hrs:  BP Temp Temp src Pulse Resp  12/31/19 0004 115/65 -- -- 73 16  12/30/19 2214 115/73 97.8 F (36.6 C) Oral 80 16   Results for orders placed or performed during the hospital encounter of 12/30/19 (from the past 24 hour(s))  Urinalysis, Routine w reflex microscopic     Status: Abnormal   Collection Time: 12/30/19 10:04 PM  Result Value Ref Range   Color, Urine YELLOW YELLOW   APPearance CLEAR CLEAR   Specific Gravity, Urine 1.006 1.005 - 1.030   pH 7.0 5.0 - 8.0   Glucose, UA NEGATIVE NEGATIVE mg/dL   Hgb urine dipstick LARGE (A) NEGATIVE   Bilirubin Urine NEGATIVE NEGATIVE   Ketones, ur NEGATIVE NEGATIVE mg/dL   Protein, ur NEGATIVE NEGATIVE mg/dL   Nitrite NEGATIVE NEGATIVE   Leukocytes,Ua NEGATIVE NEGATIVE   RBC / HPF 21-50 0 - 5 RBC/hpf   WBC, UA 0-5 0 - 5 WBC/hpf   Bacteria, UA RARE (A) NONE SEEN   Squamous Epithelial / LPF 0-5 0 - 5   Mucus PRESENT   Pregnancy, urine POC     Status: Abnormal   Collection Time: 12/30/19 10:17 PM  Result Value Ref Range   Preg Test, Ur POSITIVE (A) NEGATIVE  Wet prep, genital     Status: Abnormal   Collection Time: 12/30/19 10:27 PM  Result Value Ref Range   Yeast Wet Prep HPF POC NONE SEEN NONE SEEN   Trich, Wet Prep NONE SEEN NONE SEEN   Clue Cells Wet Prep HPF POC PRESENT (A) NONE SEEN   WBC, Wet Prep HPF POC MODERATE (A) NONE SEEN   Sperm PRESENT   CBC     Status: None   Collection Time: 12/30/19 10:38 PM  Result Value Ref Range   WBC 6.5 4.0 - 10.5 K/uL   RBC 4.16 3.87 - 5.11 MIL/uL   Hemoglobin 13.4 12.0 - 15.0 g/dL   HCT 47.4 36 - 46 %   MCV 93.5 80.0 - 100.0 fL   MCH 32.2 26.0 - 34.0 pg   MCHC 34.4 30.0 - 36.0 g/dL   RDW 25.9 56.3 - 87.5 %   Platelets 256 150 - 400 K/uL   nRBC 0.0 0.0 - 0.2 %  hCG, quantitative, pregnancy     Status: Abnormal   Collection Time: 12/30/19 10:38 PM  Result Value Ref Range   hCG, Beta Chain, Quant, S 4,972 (H) <5 mIU/mL   Comprehensive  metabolic panel     Status: Abnormal   Collection Time: 12/30/19 10:38 PM  Result Value Ref Range   Sodium 135 135 - 145 mmol/L   Potassium 3.4 (L) 3.5 - 5.1 mmol/L   Chloride 105 98 - 111 mmol/L   CO2 24 22 - 32 mmol/L   Glucose, Bld 91 70 - 99 mg/dL   BUN 5 (L) 6 - 20 mg/dL   Creatinine, Ser 5.78 0.44 - 1.00 mg/dL   Calcium 9.0 8.9 - 46.9 mg/dL   Total Protein 6.8 6.5 - 8.1 g/dL   Albumin 4.0 3.5 - 5.0 g/dL   AST 17 15 - 41 U/L   ALT 13 0 - 44 U/L   Alkaline Phosphatase 41 38 - 126 U/L   Total Bilirubin 0.6 0.3 - 1.2 mg/dL   GFR calc non Af Amer >60 >60 mL/min   GFR calc Af Amer >60 >60 mL/min   Anion gap 6 5 - 15   US OB Comp Less 14 Wks  Result Date: 12/30/2019 CLINICAL DATA:  Bleeding, cramping EXAM: OBSTETRIC <14 WK ULTRASOUND TECHNIQUE: Transabdominal ultrasound was performed for evaluation of the gestation as well as the maternal uterus and adnexal regions. COMPARISON:  None. FINDINGS: Intrauterine gestational sac: Single Yolk sac:  Not visualized Embryo:  Not visualized Cardiac Activity: Heart Rate:  bpm MSD:  4.7 mm   5 w   1 d CRL:     mm    w  d                  [place] EDC: Subchorionic hemorrhage:  Small subchorionic hemorrhage Maternal uterus/adnexae: No adnexal mass or free fluid. IMPRESSION: Early intrauterine gestational sac. No yolk sac or fetal pole currently. This could be followed with repeat ultrasound in 14 days to ensure expected progression. Small subchorionic hemorrhage. Electronically Signed   By: Charlett Nose M.D.   On: 12/30/2019 23:48   Meds ordered this encounter  Medications  . metroNIDAZOLE (FLAGYL) 500 MG tablet    Sig: Take 1 tablet (500 mg total) by mouth 2 (two) times daily.    Dispense:  14 tablet    Refill:  0    Order Specific Question:   Supervising Provider    Answer:   Duane Lope H [[id number]]   Assessment and Plan  --29 y.o. [id number] with pregnancy of unknown location --Appt scheduled for repeat stat Quant hCG at [facility] Thursday  morning --Bacterial Vaginosis c/w physical exam. Rx to pharmacy --Blood type O POS --Discharge home in stable condition  F/U: --Stat Quant 01/02/2020 at [address] [facility] --Repeat ultrasound in two weeks, order placed, cancel PRN  Calvert Cantor, CNM 12/31/2019, 12:15 AM

## 2020-01-01 LAB — GC/CHLAMYDIA PROBE AMP (~~LOC~~) NOT AT ARMC
Chlamydia: NEGATIVE
Comment: NEGATIVE
Comment: NORMAL
Neisseria Gonorrhea: NEGATIVE

## 2020-01-02 ENCOUNTER — Ambulatory Visit (INDEPENDENT_AMBULATORY_CARE_PROVIDER_SITE_OTHER): Payer: Medicaid Other

## 2020-01-02 ENCOUNTER — Other Ambulatory Visit: Payer: Self-pay | Admitting: Obstetrics

## 2020-01-02 ENCOUNTER — Telehealth: Payer: Self-pay | Admitting: Women's Health

## 2020-01-02 ENCOUNTER — Other Ambulatory Visit: Payer: Self-pay

## 2020-01-02 DIAGNOSIS — Z3490 Encounter for supervision of normal pregnancy, unspecified, unspecified trimester: Secondary | ICD-10-CM

## 2020-01-02 LAB — BETA HCG QUANT (REF LAB): hCG Quant: 6856 m[IU]/mL

## 2020-01-02 MED ORDER — VITAFOL ULTRA 29-0.6-0.4-200 MG PO CAPS: 1.0000 | ORAL_CAPSULE | Freq: Every day | ORAL | 4 refills | Status: DC

## 2020-01-02 NOTE — Telephone Encounter (Signed)
Pt called and identified by two identifiers. Discussed hCG results today and abnormal rise.  -discussed ectopic vs. SAB vs. Miscarriage -pt advised to have repeat quant at hospital on Saturday AM  After discussion, pt tearful and states, "I will have to call you back" and hung up the phone. Unable to discuss strict ectopic precautions with her at this time, but pt is aware to proceed to hospital on Saturday for repeat quant.  Marylen Ponto, NP  12:23 PM 01/02/2020

## 2020-01-02 NOTE — Progress Notes (Signed)
  SUBJECTIVE 29 years old presents for STAT HCG, blood drawn and sent to the Lab.  Denies bleeding, pain, fever, NV, chills.   PLAN Patient will be called with results.  FU Transvaginal and OB US ordered for 01/13/2020.

## 2020-01-02 NOTE — Telephone Encounter (Signed)
Opened in error.  Marylen Ponto, NP  2:18 PM 01/02/2020

## 2020-01-04 ENCOUNTER — Telehealth: Payer: Self-pay

## 2020-01-04 ENCOUNTER — Inpatient Hospital Stay (HOSPITAL_COMMUNITY)
Admission: AD | Admit: 2020-01-04 | Discharge: 2020-01-04 | Disposition: A | Discharge status: Home / Self Care | Payer: Medicaid Other | Source: Ambulatory Visit | Attending: Obstetrics and Gynecology | Admitting: Obstetrics and Gynecology

## 2020-01-04 ENCOUNTER — Other Ambulatory Visit: Payer: Self-pay

## 2020-01-04 ENCOUNTER — Inpatient Hospital Stay (HOSPITAL_COMMUNITY): Payer: Medicaid Other

## 2020-01-04 DIAGNOSIS — Z3491 Encounter for supervision of normal pregnancy, unspecified, first trimester: Secondary | ICD-10-CM | POA: Diagnosis present

## 2020-01-04 DIAGNOSIS — Z3A01 Less than 8 weeks gestation of pregnancy: Secondary | ICD-10-CM | POA: Diagnosis not present

## 2020-01-04 DIAGNOSIS — O0281 Inappropriate change in quantitative human chorionic gonadotropin (hCG) in early pregnancy: Secondary | ICD-10-CM

## 2020-01-04 LAB — HCG, QUANTITATIVE, PREGNANCY: hCG, Beta Chain, Quant, S: 21080 m[IU]/mL — ABNORMAL HIGH (ref <5)

## 2020-01-04 NOTE — Telephone Encounter (Signed)
Called patient to discuss need for repeat labs. Patient answered but after verfying who CNM was, patient hung up. No answer on return call and unable to leave VM.  Rolm Bookbinder, CNM 01/04/20 3:07 PM

## 2020-01-04 NOTE — Discharge Instructions (Signed)

## 2020-01-04 NOTE — MAU Note (Signed)
Julia Costa is a 29 y.o. at [redacted]w[redacted]d here in MAU reporting: here for follow up hcg. Denies pain or bleeding.  Pain score: 0/10  Vitals:   01/04/20 1746  BP: 114/73  Pulse: 97  Resp: 16  Temp: 98.3 F (36.8 C)  SpO2: 100%     Lab orders placed from triage: hcg

## 2020-01-04 NOTE — MAU Provider Note (Signed)
History   Chief Complaint:  Follow up  Julia Costa is  29 y.o. [id number] Patient's last menstrual period was 11/23/2019.[name] Patient is here for follow up of quantitative HCG and ongoing surveillance of pregnancy status. She is [redacted]w[redacted]d weeks gestation  by LMP.    Since her last visit, the patient is without new complaint. The patient reports bleeding as  none now.  She denies any pain.  General ROS:  negative  Her previous Quantitative HCG values are:  Results for PAIZLEY, Julia (MRN [id number]) as of 01/04/2020 19:07  Ref. Range 12/30/2019 22:38 01/02/2020 08:46  hCG Quant Latest Units: mIU/mL  6,856  HCG, Beta Chain, Quant, S Latest Ref Range: <5 mIU/mL 4,972 (H)      Physical Exam   Blood pressure 114/73, pulse 97, temperature 98.3 F (36.8 C), temperature source Oral, resp. rate 16, height 5\' 4"  (1.626 m), weight 59.6 kg, last menstrual period 11/23/2019, SpO2 100 %, not currently breastfeeding.  Focused Gynecological Exam: examination not indicated  Labs: Results for orders placed or performed during the hospital encounter of 01/04/20 (from the past 24 hour(s))  hCG, quantitative, pregnancy   Collection Time: 01/04/20  5:37 PM  Result Value Ref Range   hCG, Beta Chain, Quant, S 21,080 (H) <5 mIU/mL    Ultrasound Studies:   01/06/20 OB Comp Less 14 Wks  Result Date: 12/30/2019 CLINICAL DATA:  Bleeding, cramping EXAM: OBSTETRIC <14 WK ULTRASOUND TECHNIQUE: Transabdominal ultrasound was performed for evaluation of the gestation as well as the maternal uterus and adnexal regions. COMPARISON:  None. FINDINGS: Intrauterine gestational sac: Single Yolk sac:  Not visualized Embryo:  Not visualized Cardiac Activity: Heart Rate:  bpm MSD:  4.7 mm   5 w   1 d CRL:     mm    w  d                  01/01/2020 EDC: Subchorionic hemorrhage:  Small subchorionic hemorrhage Maternal uterus/adnexae: No adnexal mass or free fluid. IMPRESSION: Early intrauterine gestational sac. No yolk sac or fetal pole currently.  This could be followed with repeat ultrasound in 14 days to ensure expected progression. Small subchorionic hemorrhage. Electronically Signed   By: [place] M.D.   On: 12/30/2019 23:48   01/01/2020 OB Transvaginal  Result Date: 01/04/2020 CLINICAL DATA:  Abdominal pain. EXAM: OBSTETRIC <14 WK 01/06/2020 AND TRANSVAGINAL OB US TECHNIQUE: Both transabdominal and transvaginal ultrasound examinations were performed for complete evaluation of the gestation as well as the maternal uterus, adnexal regions, and pelvic cul-de-sac. Transvaginal technique was performed to assess early pregnancy. COMPARISON:  None. FINDINGS: Intrauterine gestational sac: Single Yolk sac:  Visualized. Embryo:  Not Visualized. Cardiac Activity: Not Visualized. Heart Rate: N/A  bpm MSD: 11.04 mm   5 w   6 d Subchorionic hemorrhage:  None visualized. Maternal uterus/adnexae: The right ovary measures 3.2 cm x 2.2 cm x 2.0 cm and is normal in appearance. The left ovary measures 2.3 cm x 1.2 cm x 1.1 cm and is normal in appearance. A trace amount of pelvic free fluid is seen. IMPRESSION: 1. Single intrauterine gestational sac and yolk sac, at approximately 5 weeks and 6 days gestation by ultrasound evaluation, without visualization of a fetal pole. While this may be secondary to early intrauterine pregnancy, correlation with follow-up pelvic ultrasound is recommended to confirm fetal viability. Electronically Signed   By: [place] M.D.   On: 01/04/2020 18:59    Assessment:   1. Normal  intrauterine pregnancy on prenatal ultrasound in first trimester       Plan: -Discharge home in stable condition -First trimester precautions discussed -Patient advised to follow-up with [facility] in 2 weeks for a repeat ultrasound -Patient may return to MAU as needed or if her condition were to change or worsen  Wende Mott, CNM 01/04/2020, 7:07 PM

## 2020-01-13 ENCOUNTER — Other Ambulatory Visit: Payer: Self-pay

## 2020-01-13 ENCOUNTER — Ambulatory Visit
Admission: RE | Admit: 2020-01-13 | Discharge: 2020-01-13 | Disposition: A | Discharge status: Home / Self Care | Payer: Medicaid Other | Source: Ambulatory Visit | Attending: Advanced Practice Midwife | Admitting: Advanced Practice Midwife

## 2020-01-13 DIAGNOSIS — O3680X0 Pregnancy with inconclusive fetal viability, not applicable or unspecified: Secondary | ICD-10-CM | POA: Diagnosis present

## 2020-02-03 LAB — OB RESULTS CONSOLE ABO/RH: RH Type: POSITIVE

## 2020-02-03 LAB — OB RESULTS CONSOLE ANTIBODY SCREEN: Antibody Screen: NEGATIVE

## 2020-02-05 LAB — OB RESULTS CONSOLE RUBELLA ANTIBODY, IGM: Rubella: IMMUNE

## 2020-06-16 LAB — OB RESULTS CONSOLE HEPATITIS B SURFACE ANTIGEN: Hepatitis B Surface Ag: NEGATIVE

## 2020-06-16 LAB — OB RESULTS CONSOLE RPR: RPR: NONREACTIVE

## 2020-06-16 LAB — OB RESULTS CONSOLE HIV ANTIBODY (ROUTINE TESTING): HIV: NONREACTIVE

## 2020-07-18 NOTE — L&D Delivery Note (Signed)
Delivery Note Called to bedside and patient complete and pushing. Bulging bag, AROM with light meconium. At 6:49 AM a viable female was delivered via Vaginal, Spontaneous (Presentation: Left Occiput Anterior).  APGAR: 9, 9; weight 6 lb 13.7 oz (3110 g).   Placenta status: Spontaneous, Intact.  Cord: 3 vessels with the following complications: None.   Anesthesia: Local lidoacine Episiotomy: None Lacerations: 1st degree;Perineal Suture Repair: 3.0 vicryl Est. Blood Loss (mL): 150  Mom to postpartum.  Baby to Couplet care / Skin to Skin.  Julia Costa 08/21/2020, 8:06 AM

## 2020-07-30 ENCOUNTER — Encounter (HOSPITAL_COMMUNITY): Payer: Self-pay | Admitting: Obstetrics and Gynecology

## 2020-07-30 ENCOUNTER — Inpatient Hospital Stay (HOSPITAL_COMMUNITY)
Admission: AD | Admit: 2020-07-30 | Discharge: 2020-07-30 | Disposition: A | Discharge status: Home / Self Care | Payer: Medicaid Other | Attending: Obstetrics and Gynecology | Admitting: Obstetrics and Gynecology

## 2020-07-30 ENCOUNTER — Other Ambulatory Visit: Payer: Self-pay

## 2020-07-30 DIAGNOSIS — Z3A35 35 weeks gestation of pregnancy: Secondary | ICD-10-CM

## 2020-07-30 DIAGNOSIS — Z20822 Contact with and (suspected) exposure to covid-19: Secondary | ICD-10-CM | POA: Diagnosis not present

## 2020-07-30 DIAGNOSIS — R059 Cough, unspecified: Secondary | ICD-10-CM | POA: Diagnosis not present

## 2020-07-30 DIAGNOSIS — O0943 Supervision of pregnancy with grand multiparity, third trimester: Secondary | ICD-10-CM | POA: Diagnosis not present

## 2020-07-30 DIAGNOSIS — R6883 Chills (without fever): Secondary | ICD-10-CM | POA: Diagnosis not present

## 2020-07-30 DIAGNOSIS — O99891 Other specified diseases and conditions complicating pregnancy: Secondary | ICD-10-CM

## 2020-07-30 DIAGNOSIS — Z3689 Encounter for other specified antenatal screening: Secondary | ICD-10-CM

## 2020-07-30 DIAGNOSIS — R5381 Other malaise: Secondary | ICD-10-CM

## 2020-07-30 DIAGNOSIS — R197 Diarrhea, unspecified: Secondary | ICD-10-CM

## 2020-07-30 DIAGNOSIS — R0981 Nasal congestion: Secondary | ICD-10-CM | POA: Diagnosis not present

## 2020-07-30 DIAGNOSIS — R110 Nausea: Secondary | ICD-10-CM

## 2020-07-30 DIAGNOSIS — M791 Myalgia, unspecified site: Secondary | ICD-10-CM | POA: Diagnosis not present

## 2020-07-30 LAB — SARS CORONAVIRUS 2 (TAT 6-24 HRS): SARS Coronavirus 2: NEGATIVE

## 2020-07-30 NOTE — Discharge Instructions (Signed)
10 Things You Can Do to Manage Your COVID-19 Symptoms at Home If you have possible or confirmed COVID-19: 1. Stay home except to get medical care. 2. Monitor your symptoms carefully. If your symptoms get worse, call your healthcare provider immediately. 3. Get rest and stay hydrated. 4. If you have a medical appointment, call the healthcare provider ahead of time and tell them that you have or may have COVID-19. 5. For medical emergencies, call 911 and notify the dispatch personnel that you have or may have COVID-19. 6. Cover your cough and sneezes with a tissue or use the inside of your elbow. 7. Wash your hands often with soap and water for at least 20 seconds or clean your hands with an alcohol-based hand sanitizer that contains at least 60% alcohol. 8. As much as possible, stay in a specific room and away from other people in your home. Also, you should use a separate bathroom, if available. If you need to be around other people in or outside of the home, wear a mask. 9. Avoid sharing personal items with other people in your household, like dishes, towels, and bedding. 10. Clean all surfaces that are touched often, like counters, tabletops, and doorknobs. Use household cleaning sprays or wipes according to the label instructions. cdc.gov/coronavirus 01/31/2020 This information is not intended to replace advice given to you by your health care provider. Make sure you discuss any questions you have with your health care provider. Document Revised: 05/18/2020 Document Reviewed: 05/18/2020 Elsevier Patient Education  2021 Elsevier Inc.   Safe Medications in Pregnancy   Acne: Benzoyl Peroxide Salicylic Acid  Backache/Headache: Tylenol: 2 regular strength every 4 hours OR              2 Extra strength every 6 hours  Colds/Coughs/Allergies: Benadryl (alcohol free) 25 mg every 6 hours as needed Breath right strips Claritin Cepacol throat lozenges Chloraseptic throat spray Cold-Eeze-  up to three times per day Cough drops, alcohol free Flonase (by prescription only) Guaifenesin Mucinex Robitussin DM (plain only, alcohol free) Saline nasal spray/drops Sudafed (pseudoephedrine) & Actifed ** use only after [redacted] weeks gestation and if you do not have high blood pressure Tylenol Vicks Vaporub Zinc lozenges Zyrtec   Constipation: Colace Ducolax suppositories Fleet enema Glycerin suppositories Metamucil Milk of magnesia Miralax Senokot Smooth move tea  Diarrhea: Kaopectate Imodium A-D  *NO pepto Bismol  Hemorrhoids: Anusol Anusol HC Preparation H Tucks  Indigestion: Tums Maalox Mylanta Zantac  Pepcid  Insomnia: Benadryl (alcohol free) 25mg every 6 hours as needed Tylenol PM Unisom, no Gelcaps  Leg Cramps: Tums MagGel  Nausea/Vomiting:  Bonine Dramamine Emetrol Ginger extract Sea bands Meclizine  Nausea medication to take during pregnancy:  Unisom (doxylamine succinate 25 mg tablets) Take one tablet daily at bedtime. If symptoms are not adequately controlled, the dose can be increased to a maximum recommended dose of two tablets daily (1/2 tablet in the morning, 1/2 tablet mid-afternoon and one at bedtime). Vitamin B6 100mg tablets. Take one tablet twice a day (up to 200 mg per day).  Skin Rashes: Aveeno products Benadryl cream or 25mg every 6 hours as needed Calamine Lotion 1% cortisone cream  Yeast infection: Gyne-lotrimin 7 Monistat 7  Gum/tooth pain: Anbesol  **If taking multiple medications, please check labels to avoid duplicating the same active ingredients **take medication as directed on the label ** Do not exceed 4000 mg of tylenol in 24 hours **Do not take medications that contain aspirin or ibuprofen     

## 2020-07-30 NOTE — MAU Provider Note (Signed)
History     CSN: [id number]  Arrival date and time: 07/30/20 1724   Event Date/Time   First Provider Initiated Contact with Patient 07/30/20 1822      Chief Complaint  Patient presents with  . Chills  . Generalized Body Aches   Julia Costa is a 30 y.o. [id number] at [redacted]w[redacted]d who receives care at [facility].  She presents today for Chills and Generalized Body Aches.  She states her symptoms started last night around "wee hours." She reports it started with a migraine and it resolved, but with the onset of chills and body aches.  She states this has continued throughout the day.  She states she was unable to take her temperature. She endorses fetal movement and denies abdominal cramping, but reports "Braxton hicks... that just harden."  She reports diarrhea in the form of "loose" stools that started today.  She reports having one or two bowel movements. She denies sick individuals in the household currently, but states her husband was not feeling well on Tuesday.    OB History    Gravida  9   Para  5   Term  5   Preterm      AB  3   Living  5     SAB  1   IAB  2   Ectopic      Multiple  0   Live Births  5           Past Medical History:  Diagnosis Date  . Encounter for supervision of normal pregnancy, unspecified, unspecified trimester 06/25/2018   Babyrx Op- Office supplied Nursing Staff Provider Office Location  [facility] Dating   lmp=18wk u/s Language  English Anatomy US  Neg  Flu Vaccine  Declined 06/25/18 Genetic Screen  NIPS: low risk   AFP: neg  TDaP vaccine   declined 09/19/18 Hgb A1C or  GTT Early n/a Third trimester nl 2 hour Rhogam     LAB RESULTS  Feeding Plan Breast/Bottle Blood Type O/Positive/-- (12/09 1035) O pos Contraception   . GERD (gastroesophageal reflux disease)   . Pregnancy induced hypertension    with her first pregnancy  . Short interval between pregnancies affecting pregnancy, antepartum 08/08/2018   SVD 10/2017  . SVD (spontaneous vaginal  delivery) 12/06/2018  . Unwanted fertility 09/19/2018    Past Surgical History:  Procedure Laterality Date  . NO PAST SURGERIES    . termination of pregnancy      Family History  Problem Relation Age of Onset  . Alcohol abuse Neg Hx   . Anxiety disorder Neg Hx   . Arthritis Neg Hx   . Asthma Neg Hx   . Birth defects Neg Hx   . Cancer Neg Hx   . COPD Neg Hx   . Depression Neg Hx   . Diabetes Neg Hx   . Early death Neg Hx   . Drug abuse Neg Hx   . Hearing loss Neg Hx   . Heart disease Neg Hx   . Hypertension Neg Hx   . Hyperlipidemia Neg Hx   . Intellectual disability Neg Hx   . Kidney disease Neg Hx   . Learning disabilities Neg Hx   . Miscarriages / Stillbirths Neg Hx   . Obesity Neg Hx   . Stroke Neg Hx   . Vision loss Neg Hx   . Varicose Veins Neg Hx   . ADD / ADHD Neg Hx     Social History  Tobacco Use  . Smoking status: Never Smoker  . Smokeless tobacco: Never Used  Vaping Use  . Vaping Use: Never used  Substance Use Topics  . Alcohol use: No  . Drug use: No    Allergies: No Known Allergies  Medications Prior to Admission  Medication Sig Dispense Refill Last Dose  . Prenat-Fe Poly-Methfol-FA-DHA (VITAFOL ULTRA) 29-0.6-0.4-200 MG CAPS TAKE 1 CAPSULE BY MOUTH ONCE DAILY BEFORE BREAKFAST 30 capsule 0 07/30/2020 at Unknown time  . metroNIDAZOLE (FLAGYL) 500 MG tablet Take 1 tablet (500 mg total) by mouth 2 (two) times daily. 14 tablet 0   . omeprazole (PRILOSEC) 20 MG capsule Take 1 capsule (20 mg total) by mouth daily. (Patient not taking: Reported on 01/02/2020) 30 capsule 1     Review of Systems  Constitutional: Positive for chills. Negative for fever.  HENT: Positive for congestion. Negative for sinus pressure, sinus pain, sneezing and sore throat.   Respiratory: Positive for cough. Negative for chest tightness and shortness of breath.   Cardiovascular: Negative for chest pain.  Gastrointestinal: Positive for diarrhea and nausea. Negative for abdominal  pain, constipation and vomiting.   Physical Exam   Blood pressure 119/72, pulse 98, temperature 98.5 F (36.9 C), resp. rate 18, height 5\' 4"  (1.626 m), last menstrual period 11/23/2019, SpO2 99 %, not currently breastfeeding.  Physical Exam Constitutional:      Appearance: Normal appearance.  HENT:     Head: Normocephalic and atraumatic.  Eyes:     Conjunctiva/sclera: Conjunctivae normal.  Cardiovascular:     Rate and Rhythm: Normal rate and regular rhythm.     Heart sounds: Normal heart sounds.  Pulmonary:     Effort: Pulmonary effort is normal. No respiratory distress.     Breath sounds: Normal breath sounds.  Abdominal:     General: Bowel sounds are normal.     Palpations: Abdomen is soft.     Comments: Gravid, Appears AGA  Musculoskeletal:     Cervical back: Normal range of motion.  Skin:    General: Skin is warm and dry.  Neurological:     Mental Status: She is alert and oriented to person, place, and time.  Psychiatric:        Mood and Affect: Mood normal.        Behavior: Behavior normal.        Thought Content: Thought content normal.     Fetal Assessment 135 bpm, Mod Var, -Decels, +Accels Toco:  No ctx graphed  MAU Course  No results found for this or any previous visit (from the past 24 hour(s)). No results found.  MDM PE Labs: EFM  Assessment and Plan  30 year old 37  SIUP at 35.5 weeks Cat I FT Flu-Like Symptoms  -Covid swab collected. -POC Reviewed -Exam performed. -Discussed releasing of results upon completion. -NST Reactive. -Encouraged to quarantine at home. -Encouraged to call or return to MAU if symptoms worsen or with the onset of new symptoms. -Discharged to home in stable condition.  [id number] MSN, CNM Advanced Practice Provider, [facility]     [facility] MSN, CNM 07/30/2020, 6:22 PM

## 2020-07-30 NOTE — MAU Note (Signed)
Pt stated she started feeling body aches and chills last night no cough but has nasal congestion.  Pt stated last night baby was not moving a  Much but more active today. Denies any abd pain or cramping. No leaking or vag bleeding reported.

## 2020-08-06 LAB — OB RESULTS CONSOLE GC/CHLAMYDIA
Chlamydia: NEGATIVE
Gonorrhea: NEGATIVE

## 2020-08-06 LAB — OB RESULTS CONSOLE GBS: GBS: NEGATIVE

## 2020-08-10 IMAGING — US US OB COMP LESS 14 WK
1 series · 15 of 28 positions shown · non-contrast
Comparison: None.

CLINICAL DATA: Bleeding, cramping

EXAM:
OBSTETRIC <14 WK ULTRASOUND
TECHNIQUE: Transabdominal ultrasound was performed for evaluation of the
gestation as well as the maternal uterus and adnexal regions.

[Series 1: us ob comp less 14 wk · 15 of 32 slices shown]
[im 1/32]
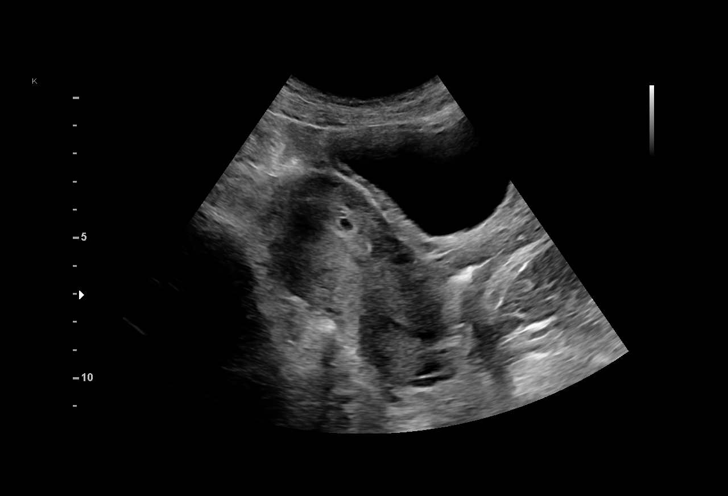
[im 3/32]
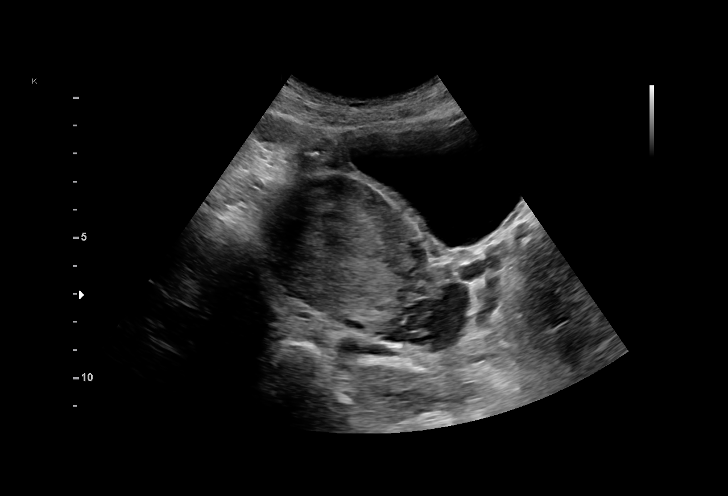
[im 5/32]
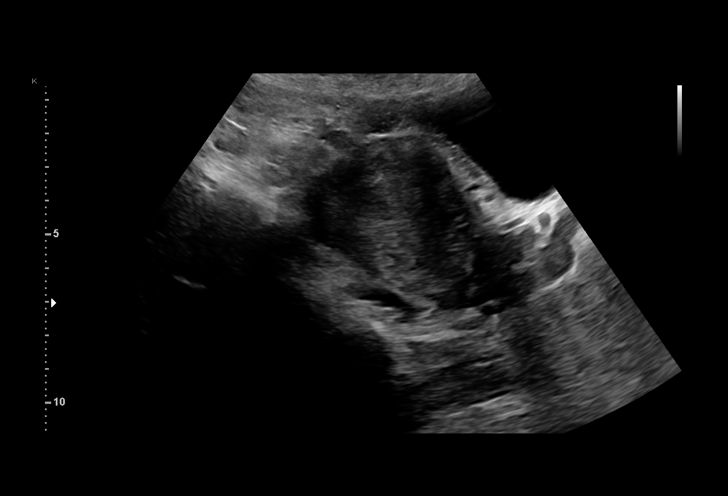
[im 7/32]
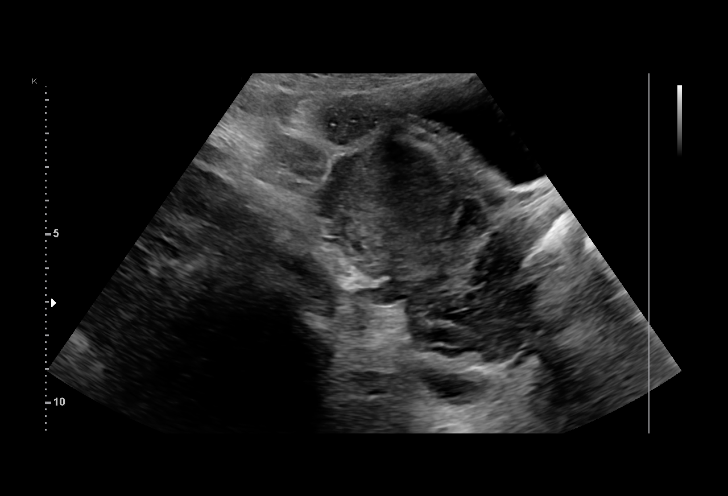
[im 10/32]
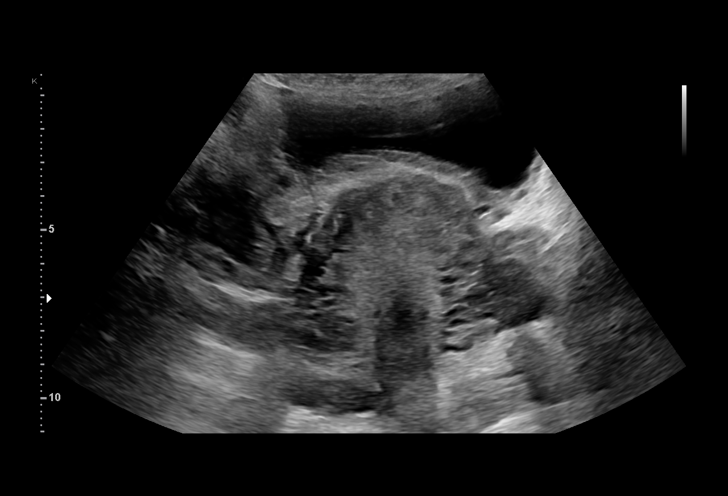
[im 12/32]
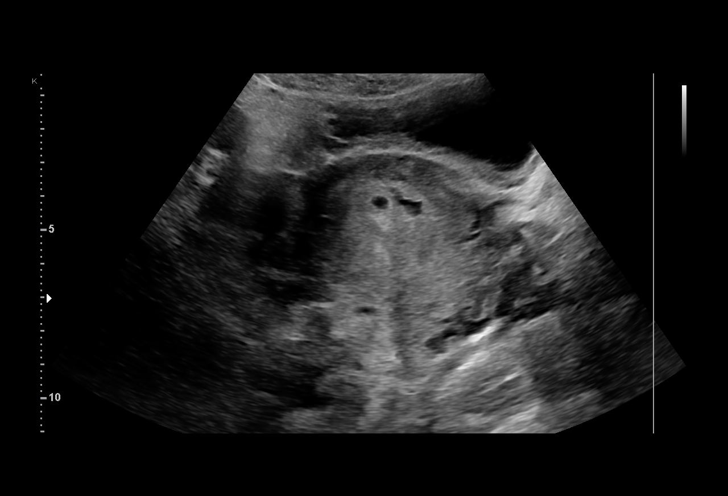
[im 14/32]
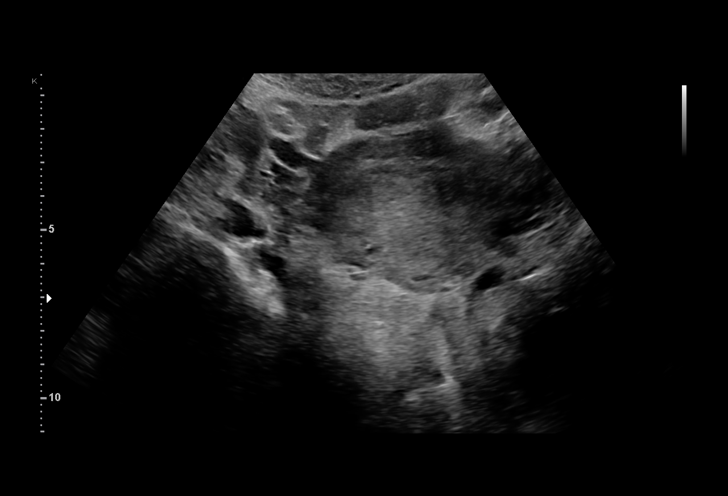
[im 17/32]
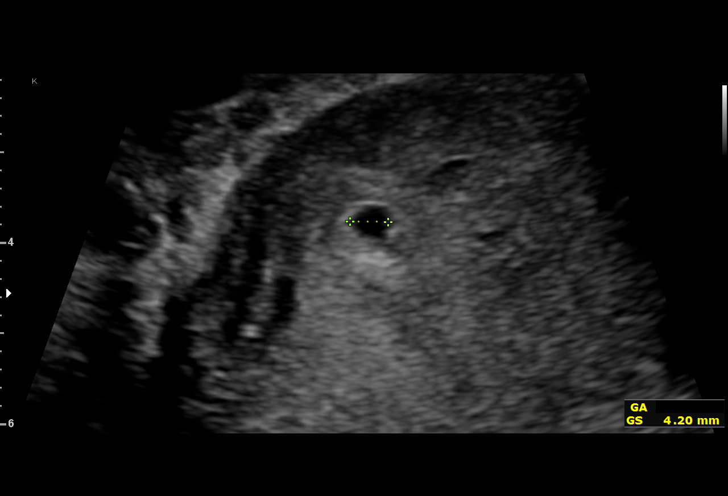
[im 18/32]
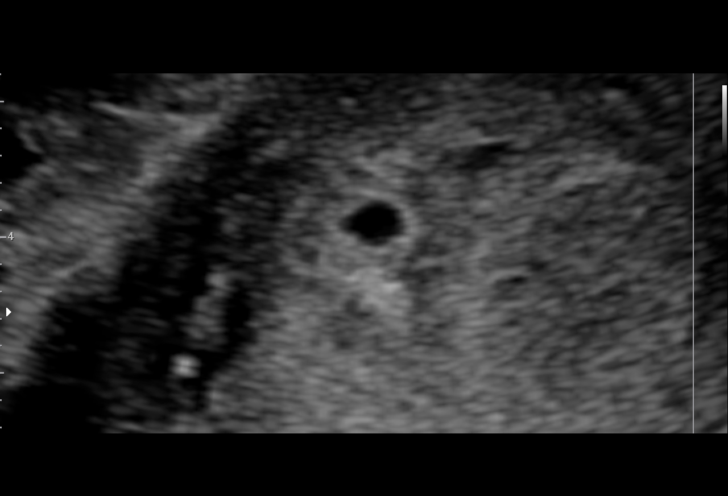
[im 20/32]
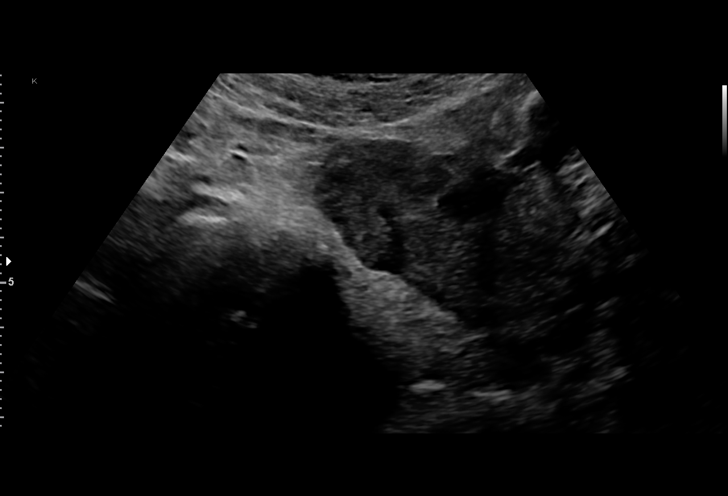
[im 22/32]
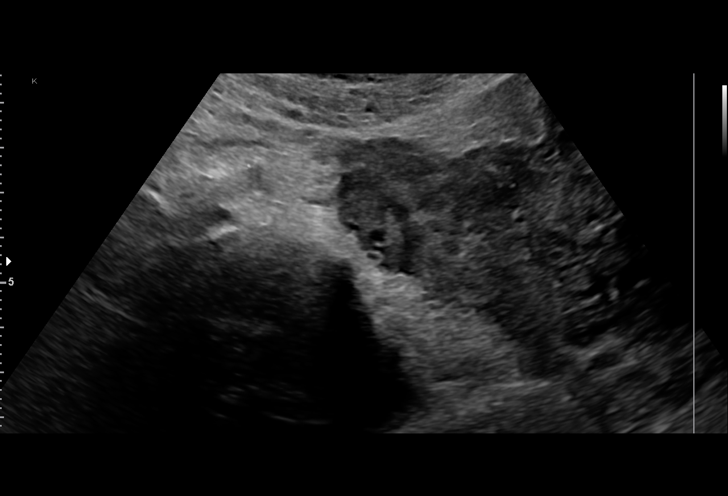
[im 25/32]
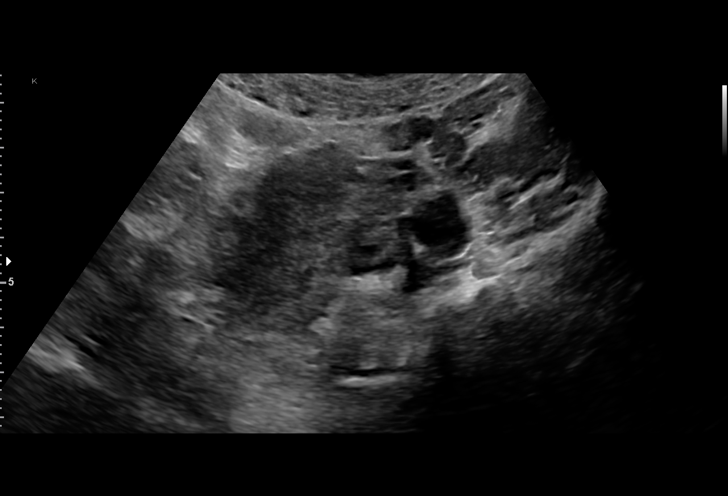
[im 27/32]
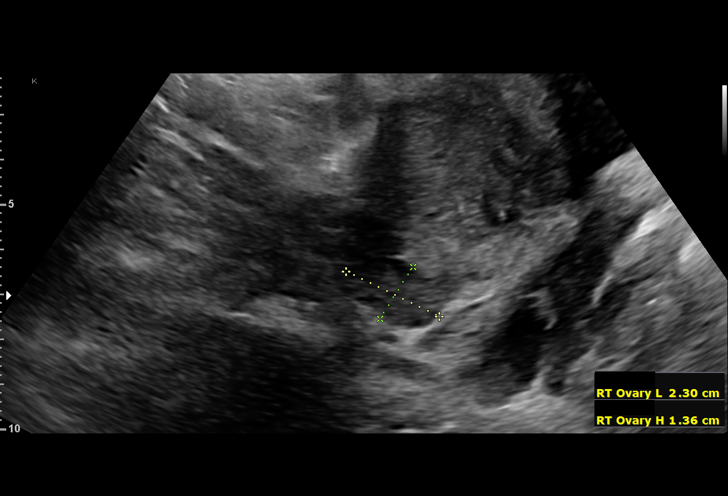
[im 29/32]
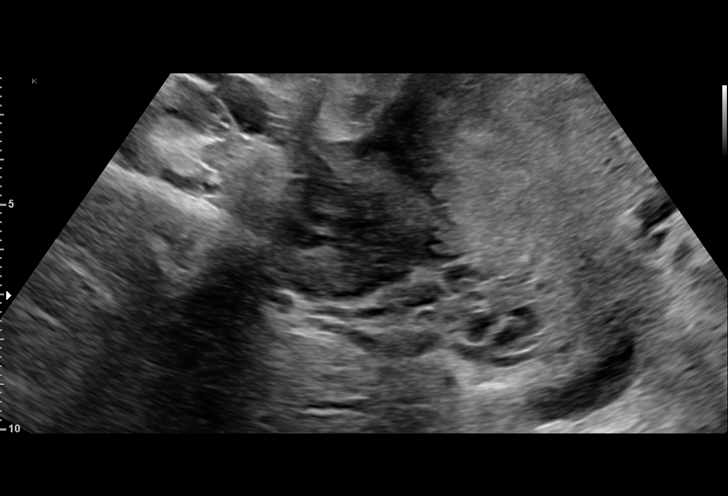
[im 32/32]
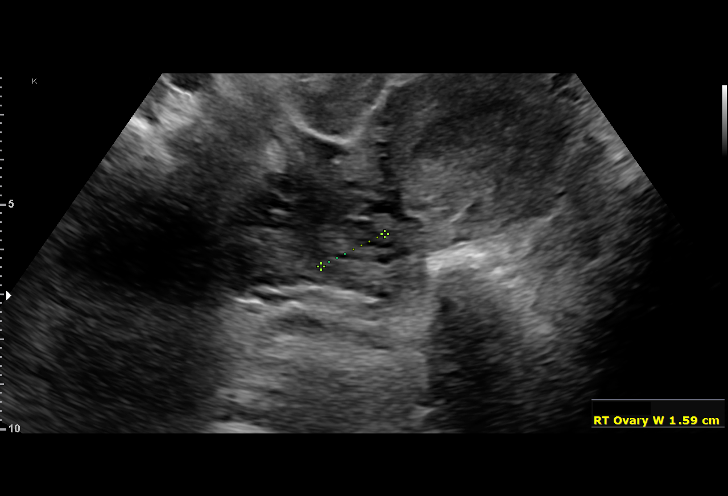

[15 of 28 positions shown; findings below may reference images not displayed]

FINDINGS: Intrauterine gestational sac: Single

Yolk sac:  Not visualized

Embryo:  Not visualized

Cardiac Activity:

Heart Rate:  bpm

MSD:  4.7 mm   5 w   1 d

CRL:     mm    w  d                  US EDC:

Subchorionic hemorrhage:  Small subchorionic hemorrhage

Maternal uterus/adnexae: No adnexal mass or free fluid.
IMPRESSION: Early intrauterine gestational sac. No yolk sac or fetal pole
currently. This could be followed with repeat ultrasound in 14 days
to ensure expected progression.

Small subchorionic hemorrhage.

## 2020-08-21 ENCOUNTER — Other Ambulatory Visit: Payer: Self-pay

## 2020-08-21 ENCOUNTER — Inpatient Hospital Stay (HOSPITAL_COMMUNITY): Payer: Medicaid Other | Admitting: Anesthesiology

## 2020-08-21 ENCOUNTER — Encounter (HOSPITAL_COMMUNITY): Payer: Self-pay | Admitting: Obstetrics and Gynecology

## 2020-08-21 ENCOUNTER — Inpatient Hospital Stay (HOSPITAL_COMMUNITY)
Admission: AD | Admit: 2020-08-21 | Discharge: 2020-08-22 | DRG: 807 | Disposition: A | Discharge status: Home / Self Care | Payer: Medicaid Other | Attending: Obstetrics and Gynecology | Admitting: Obstetrics and Gynecology

## 2020-08-21 DIAGNOSIS — O99334 Smoking (tobacco) complicating childbirth: Secondary | ICD-10-CM | POA: Diagnosis present

## 2020-08-21 DIAGNOSIS — Z3A38 38 weeks gestation of pregnancy: Secondary | ICD-10-CM

## 2020-08-21 DIAGNOSIS — Z720 Tobacco use: Secondary | ICD-10-CM | POA: Diagnosis present

## 2020-08-21 DIAGNOSIS — O26893 Other specified pregnancy related conditions, third trimester: Secondary | ICD-10-CM | POA: Diagnosis present

## 2020-08-21 DIAGNOSIS — Z9889 Other specified postprocedural states: Secondary | ICD-10-CM | POA: Diagnosis not present

## 2020-08-21 DIAGNOSIS — F17210 Nicotine dependence, cigarettes, uncomplicated: Secondary | ICD-10-CM | POA: Diagnosis present

## 2020-08-21 DIAGNOSIS — Z20822 Contact with and (suspected) exposure to covid-19: Secondary | ICD-10-CM | POA: Diagnosis present

## 2020-08-21 LAB — CBC
HCT: 33.6 % — ABNORMAL LOW (ref 36.0–46.0)
Hemoglobin: 11.6 g/dL — ABNORMAL LOW (ref 12.0–15.0)
MCH: 31.1 pg (ref 26.0–34.0)
MCHC: 34.5 g/dL (ref 30.0–36.0)
MCV: 90.1 fL (ref 80.0–100.0)
Platelets: 198 10*3/uL (ref 150–400)
RBC: 3.73 MIL/uL — ABNORMAL LOW (ref 3.87–5.11)
RDW: 14.3 % (ref 11.5–15.5)
WBC: 8.7 10*3/uL (ref 4.0–10.5)
nRBC: 0 % (ref 0.0–0.2)

## 2020-08-21 LAB — COMPREHENSIVE METABOLIC PANEL
ALT: 17 U/L (ref 0–44)
AST: 25 U/L (ref 15–41)
Albumin: 2.5 g/dL — ABNORMAL LOW (ref 3.5–5.0)
Alkaline Phosphatase: 156 U/L — ABNORMAL HIGH (ref 38–126)
Anion gap: 9 (ref 5–15)
BUN: 5 mg/dL — ABNORMAL LOW (ref 6–20)
CO2: 22 mmol/L (ref 22–32)
Calcium: 8.6 mg/dL — ABNORMAL LOW (ref 8.9–10.3)
Chloride: 101 mmol/L (ref 98–111)
Creatinine, Ser: 0.59 mg/dL (ref 0.44–1.00)
GFR, Estimated: >60 mL/min (ref >60)
Glucose, Bld: 109 mg/dL — ABNORMAL HIGH (ref 70–99)
Potassium: 3.4 mmol/L — ABNORMAL LOW (ref 3.5–5.1)
Sodium: 132 mmol/L — ABNORMAL LOW (ref 135–145)
Total Bilirubin: 0.5 mg/dL (ref 0.3–1.2)
Total Protein: 5.7 g/dL — ABNORMAL LOW (ref 6.5–8.1)

## 2020-08-21 LAB — RPR: RPR Ser Ql: NONREACTIVE

## 2020-08-21 LAB — SARS CORONAVIRUS 2 BY RT PCR (HOSPITAL ORDER, PERFORMED IN ~~LOC~~ HOSPITAL LAB): SARS Coronavirus 2: NEGATIVE

## 2020-08-21 LAB — TYPE AND SCREEN
ABO/RH(D): O POS
Antibody Screen: NEGATIVE

## 2020-08-21 MED ORDER — ONDANSETRON HCL 4 MG/2ML IJ SOLN
4.0000 mg | Freq: Four times a day (QID) | INTRAMUSCULAR | Status: DC | PRN
Start: 2020-08-21 — End: 2020-08-21

## 2020-08-21 MED ORDER — DIPHENHYDRAMINE HCL 50 MG/ML IJ SOLN: 12.5000 mg | INTRAMUSCULAR | Status: DC | PRN

## 2020-08-21 MED ORDER — LACTATED RINGERS IV SOLN: 500.0000 mL | Freq: Once | INTRAVENOUS | Status: DC

## 2020-08-21 MED ORDER — LIDOCAINE HCL (PF) 1 % IJ SOLN
INTRAMUSCULAR | Status: AC
  Administered 2020-08-21: 30 mL via SUBCUTANEOUS
  Filled 2020-08-21: qty 30

## 2020-08-21 MED ORDER — SIMETHICONE 80 MG PO CHEW: 80.0000 mg | CHEWABLE_TABLET | ORAL | Status: DC | PRN

## 2020-08-21 MED ORDER — OXYTOCIN-SODIUM CHLORIDE 30-0.9 UT/500ML-% IV SOLN
2.5000 [IU]/h | INTRAVENOUS | Status: DC
  Filled 2020-08-21: qty 500

## 2020-08-21 MED ORDER — TRANEXAMIC ACID-NACL 1000-0.7 MG/100ML-% IV SOLN
INTRAVENOUS | Status: AC
  Administered 2020-08-21: 1000 mg via INTRAVENOUS
  Filled 2020-08-21: qty 100

## 2020-08-21 MED ORDER — IBUPROFEN 600 MG PO TABS
600.0000 mg | ORAL_TABLET | Freq: Four times a day (QID) | ORAL | Status: DC
  Administered 2020-08-21 (×3): 600 mg via ORAL
  Filled 2020-08-21 (×4): qty 1

## 2020-08-21 MED ORDER — BENZOCAINE-MENTHOL 20-0.5 % EX AERO: 1.0000 "application " | INHALATION_SPRAY | CUTANEOUS | Status: DC | PRN

## 2020-08-21 MED ORDER — OXYCODONE-ACETAMINOPHEN 5-325 MG PO TABS: 2.0000 | ORAL_TABLET | ORAL | Status: DC | PRN

## 2020-08-21 MED ORDER — EPHEDRINE 5 MG/ML INJ: 10.0000 mg | INTRAVENOUS | Status: DC | PRN

## 2020-08-21 MED ORDER — FENTANYL CITRATE (PF) 100 MCG/2ML IJ SOLN
100.0000 ug | INTRAMUSCULAR | Status: DC | PRN
Start: 2020-08-21 — End: 2020-08-21
  Administered 2020-08-21 (×2): 100 ug via INTRAVENOUS
  Filled 2020-08-21: qty 2

## 2020-08-21 MED ORDER — WITCH HAZEL-GLYCERIN EX PADS: 1.0000 "application " | MEDICATED_PAD | CUTANEOUS | Status: DC | PRN

## 2020-08-21 MED ORDER — OXYCODONE HCL 5 MG PO TABS
5.0000 mg | ORAL_TABLET | Freq: Once | ORAL | Status: AC
  Administered 2020-08-22: 5 mg via ORAL
  Filled 2020-08-21 (×2): qty 1

## 2020-08-21 MED ORDER — DIPHENHYDRAMINE HCL 25 MG PO CAPS: 25.0000 mg | ORAL_CAPSULE | Freq: Four times a day (QID) | ORAL | Status: DC | PRN

## 2020-08-21 MED ORDER — ACETAMINOPHEN 325 MG PO TABS
650.0000 mg | ORAL_TABLET | ORAL | Status: DC | PRN
  Administered 2020-08-21: 650 mg via ORAL
  Filled 2020-08-21: qty 2

## 2020-08-21 MED ORDER — ONDANSETRON HCL 4 MG PO TABS
4.0000 mg | ORAL_TABLET | ORAL | Status: DC | PRN
Start: 2020-08-21 — End: 2020-08-22

## 2020-08-21 MED ORDER — PRENATAL MULTIVITAMIN CH
1.0000 | ORAL_TABLET | Freq: Every day | ORAL | Status: DC
  Administered 2020-08-21: 1 via ORAL
  Filled 2020-08-21: qty 1

## 2020-08-21 MED ORDER — COCONUT OIL OIL: 1.0000 "application " | TOPICAL_OIL | Status: DC | PRN

## 2020-08-21 MED ORDER — DIBUCAINE (PERIANAL) 1 % EX OINT: 1.0000 "application " | TOPICAL_OINTMENT | CUTANEOUS | Status: DC | PRN

## 2020-08-21 MED ORDER — OXYCODONE-ACETAMINOPHEN 5-325 MG PO TABS
1.0000 | ORAL_TABLET | ORAL | Status: DC | PRN
Start: 2020-08-21 — End: 2020-08-21

## 2020-08-21 MED ORDER — LACTATED RINGERS IV SOLN
500.0000 mL | INTRAVENOUS | Status: DC | PRN
Start: 2020-08-21 — End: 2020-08-21

## 2020-08-21 MED ORDER — FENTANYL CITRATE (PF) 100 MCG/2ML IJ SOLN
INTRAMUSCULAR | Status: AC
  Filled 2020-08-21: qty 2

## 2020-08-21 MED ORDER — ONDANSETRON HCL 4 MG/2ML IJ SOLN
4.0000 mg | INTRAMUSCULAR | Status: DC | PRN
  Administered 2020-08-21: 4 mg via INTRAVENOUS
  Filled 2020-08-21: qty 2

## 2020-08-21 MED ORDER — LACTATED RINGERS IV SOLN: INTRAVENOUS | Status: DC

## 2020-08-21 MED ORDER — MEASLES, MUMPS & RUBELLA VAC IJ SOLR: 0.5000 mL | Freq: Once | INTRAMUSCULAR | Status: DC

## 2020-08-21 MED ORDER — FENTANYL-BUPIVACAINE-NACL 0.5-0.125-0.9 MG/250ML-% EP SOLN
12.0000 mL/h | EPIDURAL | Status: DC | PRN
  Filled 2020-08-21: qty 250

## 2020-08-21 MED ORDER — FENTANYL CITRATE (PF) 100 MCG/2ML IJ SOLN: 50.0000 ug | INTRAMUSCULAR | Status: DC | PRN

## 2020-08-21 MED ORDER — OXYTOCIN BOLUS FROM INFUSION
333.0000 mL | Freq: Once | INTRAVENOUS | Status: AC
  Administered 2020-08-21: 333 mL via INTRAVENOUS

## 2020-08-21 MED ORDER — TRANEXAMIC ACID-NACL 1000-0.7 MG/100ML-% IV SOLN: 1000.0000 mg | INTRAVENOUS | Status: AC

## 2020-08-21 MED ORDER — PHENYLEPHRINE 40 MCG/ML (10ML) SYRINGE FOR IV PUSH (FOR BLOOD PRESSURE SUPPORT): 80.0000 ug | PREFILLED_SYRINGE | INTRAVENOUS | Status: DC | PRN

## 2020-08-21 MED ORDER — ACETAMINOPHEN 325 MG PO TABS: 650.0000 mg | ORAL_TABLET | ORAL | Status: DC | PRN

## 2020-08-21 MED ORDER — LIDOCAINE HCL (PF) 1 % IJ SOLN: 30.0000 mL | INTRAMUSCULAR | Status: AC | PRN

## 2020-08-21 MED ORDER — SOD CITRATE-CITRIC ACID 500-334 MG/5ML PO SOLN: 30.0000 mL | ORAL | Status: DC | PRN

## 2020-08-21 MED ORDER — TETANUS-DIPHTH-ACELL PERTUSSIS 5-2.5-18.5 LF-MCG/0.5 IM SUSY: 0.5000 mL | PREFILLED_SYRINGE | Freq: Once | INTRAMUSCULAR | Status: DC

## 2020-08-21 MED ORDER — PHENYLEPHRINE 40 MCG/ML (10ML) SYRINGE FOR IV PUSH (FOR BLOOD PRESSURE SUPPORT)
80.0000 ug | PREFILLED_SYRINGE | INTRAVENOUS | Status: DC | PRN
  Filled 2020-08-21: qty 10

## 2020-08-21 MED ORDER — SENNOSIDES-DOCUSATE SODIUM 8.6-50 MG PO TABS
2.0000 | ORAL_TABLET | ORAL | Status: DC
  Administered 2020-08-21: 2 via ORAL
  Filled 2020-08-21 (×3): qty 2

## 2020-08-21 NOTE — Lactation Note (Signed)
This note was copied from a baby's chart. Lactation Consultation Note  Patient Name: Julia Costa FVCBS'W Date: 08/21/2020 Reason for consult: L&D Initial assessment;Term Age:30 hours   LC in to assist with early breastfeeding education and assistance.  Mom has tried breastfeeding with her 5 other children and combo fed formula and breastmilk for about a month.   Baby sleeping on Mom's chest.  Mom with full breasts and erect nipples with very compressible areola.    Reviewed breast massage and hand expression.  Colostrum easily expressed.  Placed baby in football hold and cradle and baby started crying.  Mom immediately placed baby STS on her chest.  Encouraged keeping baby STS and watching baby for feeding cues to offer breast.  Mom to ask for assistance prn.  Maternal Data Has patient been taught Hand Expression?: Yes Does the patient have breastfeeding experience prior to this delivery?: Yes How long did the patient breastfeed?: 1 month  Feeding Mother's Current Feeding Choice: Breast Milk  LATCH Score Latch: Too sleepy or reluctant, no latch achieved, no sucking elicited.  Audible Swallowing: None  Type of Nipple: Everted at rest and after stimulation  Comfort (Breast/Nipple): Soft / non-tender  Hold (Positioning): Full assist, staff holds infant at breast  LATCH Score: 4  Interventions Interventions: Breast feeding basics reviewed;Assisted with latch;Skin to skin;Breast massage;Hand express;Support pillows;Position options   Consult Status Consult Status: Follow-up Date: 08/22/20 Follow-up type: In-patient    [name] 08/21/2020, 7:42 AM

## 2020-08-21 NOTE — Discharge Instructions (Signed)
-take tylenol 1000 mg every 6 hours as needed for pain, alternate with ibuprofen 600 mg every 6 hours -drink plenty of water to help with breastfeeding -continue prenatal vitamins while you are breastfeeding -think about birth control options-->bedisider.org is a great website! You can get any form of birth control from the health department for free   Postpartum Care After Vaginal Delivery The following information offers guidance about how to care for yourself from the time you deliver your baby to 6-12 weeks after delivery (postpartum period). If you have problems or questions, contact your health care provider for more specific instructions. Follow these instructions at home: Vaginal bleeding  It is normal to have vaginal bleeding (lochia) after delivery. Wear a sanitary pad for bleeding and discharge. ? During the first week after delivery, the amount and appearance of lochia is often similar to a menstrual period. ? Over the next few weeks, it will gradually decrease to a dry, yellow-brown discharge. ? For most women, lochia stops completely by 4-6 weeks after delivery, but can vary.  Change your sanitary pads frequently. Watch for any changes in your flow, such as: ? A sudden increase in volume. ? A change in color. ? Large blood clots.  If you pass a blood clot from your vagina, save it and call your health care provider. Do not flush blood clots down the toilet before talking with your health care provider.  Do not use tampons or douches until your health care provider approves.  If you are not breastfeeding, your period should return 6-8 weeks after delivery. If you are feeding your baby breast milk only, your period may not return until you stop breastfeeding. Perineal care  Keep the area between the vagina and the anus (perineum) clean and dry. Use medicated pads and pain-relieving sprays and creams as directed.  If you had a surgical cut in the perineum (episiotomy) or a  tear, check the area for signs of infection until you are healed. Check for: ? More redness, swelling, or pain. ? Fluid or blood coming from the cut or tear. ? Warmth. ? Pus or a bad smell.  You may be given a squirt bottle to use instead of wiping to clean the perineum area after you use the bathroom. Pat the area gently to dry it.  To relieve pain caused by an episiotomy, a tear, or swollen veins in the anus (hemorrhoids), take a warm sitz bath 2-3 times a day. In a sitz bath, the warm water should only come up to your hips and cover your buttocks.   Breast care  In the first few days after delivery, your breasts may feel heavy, full, and uncomfortable (breast engorgement). Milk may also leak from your breasts. Ask your health care provider about ways to help relieve the discomfort.  If you are breastfeeding: ? Wear a bra that supports your breasts and fits well. Use breast pads to absorb milk that leaks. ? Keep your nipples clean and dry. Apply creams and ointments as told. ? You may have uterine contractions every time you breastfeed for up to several weeks after delivery. This helps your uterus return to its normal size. ? If you have any problems with breastfeeding, notify your health care provider or lactation consultant.  If you are not breastfeeding: ? Avoid touching your breasts. Do not squeeze out (express) milk. Doing this can make your breasts produce more milk. ? Wear a good-fitting bra and use cold packs to help with swelling. Intimacy   and sexuality  Ask your health care provider when you can engage in sexual activity. This may depend upon: ? Your risk of infection. ? How fast you are healing. ? Your comfort and desire to engage in sexual activity.  You are able to get pregnant after delivery, even if you have not had your period. Talk with your health care provider about methods of birth control (contraception) or family planning if you desire future  pregnancies. Medicines  Take over-the-counter and prescription medicines only as told by your health care provider.  Take an over-the-counter stool softener to help ease bowel movements as told by your health care provider.  If you were prescribed an antibiotic medicine, take it as told by your health care provider. Do not stop taking the antibiotic even if you start to feel better.  Review all previous and current prescriptions to check for possible transfer into breast milk. Activity  Gradually return to your normal activities as told by your health care provider.  Rest as much as possible. Nap while your baby is sleeping. Eating and drinking  Drink enough fluid to keep your urine pale yellow.  To help prevent or relieve constipation, eat high-fiber foods every day.  Choose healthy eating to support breastfeeding or weight loss goals.  Take your prenatal vitamins until your health care provider tells you to stop.   General tips/recommendations  Do not use any products that contain nicotine or tobacco. These products include cigarettes, chewing tobacco, and vaping devices, such as e-cigarettes. If you need help quitting, ask your health care provider.  Do not drink alcohol, especially if you are breastfeeding.  Do not take medications or drugs that are not prescribed to you, especially if you are breastfeeding.  Visit your health care provider for a postpartum checkup within the first 3-6 weeks after delivery.  Complete a comprehensive postpartum visit no later than 12 weeks after delivery.  Keep all follow-up visits for you and your baby. Contact a health care provider if:  You feel unusually sad or worried.  Your breasts become red, painful, or hard.  You have a fever or other signs of an infection.  You have bleeding that is soaking through one pad an hour or you have blood clots.  You have a severe headache that doesn't go away or you have vision changes.  You  have nausea and vomiting and are unable to eat or drink anything for 24 hours. Get help right away if:  You have chest pain or difficulty breathing.  You have sudden, severe leg pain.  You faint or have a seizure.  You have thoughts about hurting yourself or your baby. If you ever feel like you may hurt yourself or others, or have thoughts about taking your own life, get help right away. Go to your nearest emergency department or:  Call your local emergency services (911 in the [place]  The National Suicide Prevention Lifeline at 1-800-273-8255. This suicide crisis helpline is open 24 hours a day.  Text the Crisis Text Line at 741741 (in the [place] Summary  The period of time after you deliver your newborn up to 6-12 weeks after delivery is called the postpartum period.  Keep all follow-up visits for you and your baby.  Review all previous and current prescriptions to check for possible transfer into breast milk.  Contact a health care provider if you feel unusually sad or worried during the postpartum period. This information is not intended to replace advice given to   you by your health care provider. Make sure you discuss any questions you have with your health care provider. Document Revised: 03/19/2020 Document Reviewed: 03/19/2020 Elsevier Patient Education  2021 Elsevier Inc.  

## 2020-08-21 NOTE — Discharge Summary (Signed)
Postpartum Discharge Summary    Patient Name: Julia Costa DOB: 1990-09-19 MRN: [id number]  Date of admission: 08/21/2020 Delivery date:08/21/2020  Delivering provider: Arrie Senate  Date of discharge: 08/22/2020  Admitting diagnosis: Normal labor [O80, Z37.9] Intrauterine pregnancy: [redacted]w[redacted]d    Secondary diagnosis:  Active Problems:   Normal labor   Vaginal delivery   Tobacco abuse  Additional problems: none    Discharge diagnosis: Term Pregnancy Delivered                                              Post partum procedures: none Augmentation: AROM Complications: None  Hospital course: Onset of Labor With Vaginal Delivery      30y.o. yo [id number] admitted in Active Labor on 08/21/2020. Patient had an uncomplicated labor course as follows:  Membrane Rupture Time/Date: 6:37 AM ,08/21/2020   Delivery Method:Vaginal, Spontaneous  Episiotomy: None  Lacerations:  1st degree;Perineal  Patient had an uncomplicated postpartum course.  She is ambulating, tolerating a regular diet, passing flatus, and urinating well. Patient is discharged home in stable condition on 08/22/20.  Newborn Data: Birth date:08/21/2020  Birth time:6:49 AM  Gender:Female  Living status:Living  Apgars:9 ,9  Weight:3110 g   Magnesium Sulfate received: No BMZ received: No Rhophylac:N/A MMR:N/A T-DaP:Given prenatally Flu: No Transfusion:No  Physical exam  Vitals:   08/21/20 1345 08/21/20 1749 08/21/20 2056 08/22/20 0540  BP: 120/71 115/63 113/60 116/66  Pulse: 70 68 66 67  Resp: 16 16 16 16   Temp: 98.2 F (36.8 C) (!) 97.4 F (36.3 C) 97.6 F (36.4 C) 97.6 F (36.4 C)  TempSrc: Oral Oral Oral Oral  SpO2: 100% 100% 100% 100%  Weight:      Height:       General: alert, cooperative and no distress Lochia: appropriate Uterine Fundus: firm Incision: N/A DVT Evaluation: No evidence of DVT seen on physical exam. No cords or calf tenderness. No significant calf/ankle edema. Labs: Lab  Results  Component Value Date   WBC 8.7 08/21/2020   HGB 11.6 (L) 08/21/2020   HCT 33.6 (L) 08/21/2020   MCV 90.1 08/21/2020   PLT 198 08/21/2020   CMP Latest Ref Rng & Units 08/21/2020  Glucose 70 - 99 mg/dL 109(H)  BUN 6 - 20 mg/dL 5(L)  Creatinine 0.44 - 1.00 mg/dL 0.59  Sodium 135 - 145 mmol/L 132(L)  Potassium 3.5 - 5.1 mmol/L 3.4(L)  Chloride 98 - 111 mmol/L 101  CO2 22 - 32 mmol/L 22  Calcium 8.9 - 10.3 mg/dL 8.6(L)  Total Protein 6.5 - 8.1 g/dL 5.7(L)  Total Bilirubin 0.3 - 1.2 mg/dL 0.5  Alkaline Phos 38 - 126 U/L 156(H)  AST 15 - 41 U/L 25  ALT 0 - 44 U/L 17   Edinburgh Score: Edinburgh Postnatal Depression Scale Screening Tool 08/21/2020  I have been able to laugh and see the funny side of things. 0  I have looked forward with enjoyment to things. 0  I have blamed myself unnecessarily when things went wrong. 1  I have been anxious or worried for no good reason. 0  I have felt scared or panicky for no good reason. 0  Things have been getting on top of me. 0  I have been so unhappy that I have had difficulty sleeping. 0  I have felt sad or miserable. 0  I have been so unhappy that I have been crying. 0  The thought of harming myself has occurred to me. 0  Edinburgh Postnatal Depression Scale Total 1     After visit meds:  Allergies as of 08/22/2020   No Known Allergies     Medication List    TAKE these medications   acetaminophen 325 MG tablet Commonly known as: Tylenol Take 2 tablets (650 mg total) by mouth every 6 (six) hours as needed for mild pain, moderate pain, fever or headache (for pain scale < 4).   coconut oil Oil Apply 1 application topically as needed (nipple pain).   ibuprofen 600 MG tablet Commonly known as: ADVIL Take 1 tablet (600 mg total) by mouth every 8 (eight) hours as needed for moderate pain or cramping.   norethindrone 0.35 MG tablet Commonly known as: Ortho Micronor Take 1 tablet (0.35 mg total) by mouth daily.   Vitafol Ultra  29-0.6-0.4-200 MG Caps TAKE 1 CAPSULE BY MOUTH ONCE DAILY BEFORE BREAKFAST What changed: See the new instructions.        Discharge home in stable condition Infant Feeding: Breast Infant Disposition:home with mother Discharge instruction: per After Visit Summary and Postpartum booklet. Activity: Advance as tolerated. Pelvic rest for 6 weeks.  Diet: routine diet Future Appointments:No future appointments. Follow up Visit: Pt prefers to follow[facility] at [facility] ([facility] provider) for postpartum care. Pt instructed to call to schedule appointment.  Please schedule this patient for a In person postpartum visit in 4 weeks with the following provider: Any provider. Additional Postpartum F/U:none  Low risk pregnancy complicated by: none Delivery mode:  Vaginal, Spontaneous  Anticipated Birth Control:  POPs  Goswick, Gildardo Cranker, MD OB Fellow, Faculty Practice 08/22/2020 11:20 AM

## 2020-08-21 NOTE — H&P (Signed)
OBSTETRIC ADMISSION HISTORY AND PHYSICAL  Julia Costa is a 30 y.o. female [phone] with IUP at [redacted]w[redacted]d by LMP presenting for SOL. She reports +FMs, No LOF, no VB, no blurry vision, headaches or peripheral edema, and RUQ pain.  She plans on breast feeding. She request POPs for birth control. She received her prenatal care at [facility]   Dating: By LMP --->  Estimated Date of Delivery: 08/29/20  Sono:    Unable to get full read in CareEverywhere, limited read wnl   Prenatal History/Complications:  Tobacco use Elevated BP (no diagnosis)  Past Medical History: Past Medical History:  Diagnosis Date  . Encounter for supervision of normal pregnancy, unspecified, unspecified trimester 06/25/2018   Babyrx Op- Office supplied Nursing Staff Provider Office Location  [facility] Dating   lmp=18wk u/s Language  English Anatomy US  Neg  Flu Vaccine  Declined 06/25/18 Genetic Screen  NIPS: low risk   AFP: neg  TDaP vaccine   declined 09/19/18 Hgb A1C or  GTT Early n/a Third trimester nl 2 hour Rhogam     LAB RESULTS  Feeding Plan Breast/Bottle Blood Type O/Positive/-- (12/09 1035) O pos Contraception   . GERD (gastroesophageal reflux disease)   . Pregnancy induced hypertension    with her first pregnancy  . Short interval between pregnancies affecting pregnancy, antepartum 08/08/2018   SVD 10/2017  . SVD (spontaneous vaginal delivery) 12/06/2018  . Unwanted fertility 09/19/2018    Past Surgical History: Past Surgical History:  Procedure Laterality Date  . NO PAST SURGERIES    . termination of pregnancy      Obstetrical History: OB History    Gravida  9   Para  5   Term  5   Preterm      AB  3   Living  5     SAB  1   IAB  2   Ectopic      Multiple  0   Live Births  5           Social History Social History   Socioeconomic History  . Marital status: Married    Spouse name: Jonny Ruiz  . Number of children: 5  . Years of education: Not on file  . Highest education level:  Associate degree: occupational, Scientist, product/process development, or vocational program  Occupational History  . Not on file  Tobacco Use  . Smoking status: Current Every Day Smoker    Packs/day: 0.25    Years: 4.00    Pack years: 1.00    Types: Cigarettes  . Smokeless tobacco: Never Used  Vaping Use  . Vaping Use: Never used  Substance and Sexual Activity  . Alcohol use: No  . Drug use: No  . Sexual activity: Yes    Partners: Male    Birth control/protection: None, Pill  Other Topics Concern  . Not on file  Social History Narrative  . Not on file   Social Determinants of Health   Financial Resource Strain: Not on file  Food Insecurity: Not on file  Transportation Needs: Not on file  Physical Activity: Not on file  Stress: Not on file  Social Connections: Not on file    Family History: Family History  Problem Relation Age of Onset  . Alcohol abuse Neg Hx   . Anxiety disorder Neg Hx   . Arthritis Neg Hx   . Asthma Neg Hx   . Birth defects Neg Hx   . Cancer Neg Hx   . COPD Neg Hx   .  Depression Neg Hx   . Diabetes Neg Hx   . Early death Neg Hx   . Drug abuse Neg Hx   . Hearing loss Neg Hx   . Heart disease Neg Hx   . Hypertension Neg Hx   . Hyperlipidemia Neg Hx   . Intellectual disability Neg Hx   . Kidney disease Neg Hx   . Learning disabilities Neg Hx   . Miscarriages / Stillbirths Neg Hx   . Obesity Neg Hx   . Stroke Neg Hx   . Vision loss Neg Hx   . Varicose Veins Neg Hx   . ADD / ADHD Neg Hx     Allergies: No Known Allergies  Medications Prior to Admission  Medication Sig Dispense Refill Last Dose  . Prenat-Fe Poly-Methfol-FA-DHA (VITAFOL ULTRA) 29-0.6-0.4-200 MG CAPS TAKE 1 CAPSULE BY MOUTH ONCE DAILY BEFORE BREAKFAST 30 capsule 0 08/20/2020 at Unknown time     Review of Systems   All systems reviewed and negative except as stated in HPI  Blood pressure (!) 139/94, pulse 99, temperature 98.2 F (36.8 C), temperature source Oral, resp. rate 17, height 5\' 4"   (1.626 m), weight 86.2 kg, last menstrual period 11/23/2019, not currently breastfeeding. General appearance: alert, cooperative and no distress Lungs: normal respiratory effort Heart: regular rate and rhythm Abdomen: soft, non-tender; gravid Pelvic: as noted below Extremities: Homans sign is negative, no sign of DVT Presentation: cephalic by MAU RN exam Fetal monitoringBaseline: 160 bpm, Variability: Good {> 6 bpm), Accelerations: Reactive and Decelerations: Absent Uterine activityFrequency: Every 2-4 minutes Dilation: 7.5 Effacement (%): 70 Station: -2 Exam by:: [email or web address], RN   Prenatal labs: All results in care everywhere ABO, Rh: --/--/PENDING (02/04 0510) Antibody: PENDING (02/04 0510) Rubella:  imm RPR:   nonreactive HBsAg:   nonreactive HIV:   nonreactive GBS:   neg 1 hr Glucola failed, passed 3hr Genetic screening normal per patient Anatomy 12-15-2000 placenta previa, resolved on follow up  Prenatal Transfer Tool  Maternal Diabetes: No Genetic Screening: Normal per patient Maternal Ultrasounds/Referrals: Normal Fetal Ultrasounds or other Referrals:  None Maternal Substance Abuse:  Yes:  Type: Smoker Significant Maternal Medications:  None Significant Maternal Lab Results: Group B Strep negative  Results for orders placed or performed during the hospital encounter of 08/21/20 (from the past 24 hour(s))  Type and screen [facility]   Collection Time: 08/21/20  5:10 AM  Result Value Ref Range   ABO/RH(D) PENDING    Antibody Screen PENDING    Sample Expiration      08/24/2020,2359 Performed at [facility], [address], [place], [place]   CBC   Collection Time: 08/21/20  5:20 AM  Result Value Ref Range   WBC 8.7 4.0 - 10.5 K/uL   RBC 3.73 (L) 3.87 - 5.11 MIL/uL   Hemoglobin 11.6 (L) 12.0 - 15.0 g/dL   HCT 10/19/20 (L) 96.2 - 83.6 %   MCV 90.1 80.0 - 100.0 fL   MCH 31.1 26.0 - 34.0 pg   MCHC 34.5 30.0 - 36.0 g/dL   RDW 62.9  47.6 - 54.6 %   Platelets 198 150 - 400 K/uL   nRBC 0.0 0.0 - 0.2 %    Patient Active Problem List   Diagnosis Date Noted  . 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 Anxiety disorder 01/09/2015  . Suicidal behavior     Assessment/Plan:  Julia Costa is a 30 y.o. 37 at [redacted]w[redacted]d here for SOL.  #  Labor: Continue expectant management. #Pain: PRN #FWB: Cat 1 #ID: GBS neg #MOF: breast #MOC:POPs #Circ: n/a  Alric Seton, MD  08/21/2020, 5:51 AM

## 2020-08-21 NOTE — Anesthesia Preprocedure Evaluation (Deleted)
Anesthesia Evaluation  Patient identified by MRN, date of birth, ID band Patient awake    Reviewed: Allergy & Precautions, NPO status , Patient's Chart, lab work & pertinent test results  Airway Mallampati: II  TM Distance: >3 FB Neck ROM: Full    Dental no notable dental hx.    Pulmonary neg pulmonary ROS, Current SmokerPatient did not abstain from smoking.,    Pulmonary exam normal breath sounds clear to auscultation       Cardiovascular negative cardio ROS Normal cardiovascular exam Rhythm:Regular Rate:Normal     Neuro/Psych PSYCHIATRIC DISORDERS Anxiety negative neurological ROS     GI/Hepatic Neg liver ROS, GERD  ,  Endo/Other  negative endocrine ROS  Renal/GU negative Renal ROS  negative genitourinary   Musculoskeletal negative musculoskeletal ROS (+)   Abdominal   Peds  Hematology negative hematology ROS (+)   Anesthesia Other Findings   Reproductive/Obstetrics (+) Pregnancy                             Anesthesia Physical Anesthesia Plan  ASA: II  Anesthesia Plan: Epidural   Post-op Pain Management:    Induction:   PONV Risk Score and Plan: Treatment may vary due to age or medical condition  Airway Management Planned: Natural Airway  Additional Equipment:   Intra-op Plan:   Post-operative Plan:   Informed Consent: I have reviewed the patients History and Physical, chart, labs and discussed the procedure including the risks, benefits and alternatives for the proposed anesthesia with the patient or authorized representative who has indicated his/her understanding and acceptance.       Plan Discussed with: Anesthesiologist  Anesthesia Plan Comments: (Patient identified. Risks, benefits, options discussed with patient including but not limited to bleeding, infection, nerve damage, paralysis, failed block, incomplete pain control, headache, blood pressure changes,  nausea, vomiting, reactions to medication, itching, and post partum back pain. Confirmed with bedside nurse the patient's most recent platelet count. Confirmed with the patient that they are not taking any anticoagulation, have any bleeding history or any family history of bleeding disorders. Patient expressed understanding and wishes to proceed. All questions were answered. )        Anesthesia Quick Evaluation

## 2020-08-21 NOTE — MAU Note (Signed)
CTX began at 2200. Currently "close together" and have not been timing them. +FM. No VB or LOF.  GBS-

## 2020-08-21 NOTE — Social Work (Signed)
MOB was referred for history of depression and anxiety.   * Referral screened out by Clinical Social Worker because none of the following criteria appear to apply:  ~ History of anxiety/depression during this pregnancy, or of post-partum depression following prior delivery. ~ Diagnosis of anxiety and/or depression within last 3 years. OR * MOB's symptoms currently being treated with medication and/or therapy.  Please contact the Clinical Social Worker if needs arise, by MOB request, or if MOB scores greater than 9/yes to question 10 on Edinburgh Postpartum Depression Screen.  Meena Barrantes, LCSWA Clinical Social Work [facility]  [phone]  

## 2020-08-22 DIAGNOSIS — Z720 Tobacco use: Secondary | ICD-10-CM

## 2020-08-22 DIAGNOSIS — Z9889 Other specified postprocedural states: Secondary | ICD-10-CM

## 2020-08-22 MED ORDER — ACETAMINOPHEN 325 MG PO TABS: 650.0000 mg | ORAL_TABLET | Freq: Four times a day (QID) | ORAL | Status: AC | PRN

## 2020-08-22 MED ORDER — COCONUT OIL OIL: 1.0000 "application " | TOPICAL_OIL | 0 refills | Status: AC | PRN

## 2020-08-22 MED ORDER — IBUPROFEN 600 MG PO TABS: 600.0000 mg | ORAL_TABLET | Freq: Three times a day (TID) | ORAL | 0 refills | Status: DC | PRN

## 2020-08-22 MED ORDER — NORETHINDRONE 0.35 MG PO TABS: 1.0000 | ORAL_TABLET | Freq: Every day | ORAL | 6 refills | Status: DC

## 2020-08-22 NOTE — Progress Notes (Addendum)
Patient ID: Adrienne Trombetta, female   DOB: 1991-04-20, 30 y.o.   MRN: [id number] POSTPARTUM PROGRESS NOTE  Subjective: Velta Rockholt is a 30 y.o. [id number] PPD#1 from SVD at [redacted]w[redacted]d.  She reports she is doing well. No acute events overnight. She denies any problems with ambulating, voiding or po intake. Denies nausea or vomiting. She has passed flatus. Pain is well controlled.  Lochia is minimal. VSS.  Objective: Blood pressure 116/66, pulse 67, temperature 97.6 F (36.4 C), temperature source Oral, resp. rate 16, height 5\' 4"  (1.626 m), weight 86.2 kg, last menstrual period 11/23/2019, SpO2 100 %, unknown if currently breastfeeding.  Physical Exam:  General: alert, cooperative and no distress Chest: no respiratory distress Abdomen: soft, non-tender  Uterine Fundus: firm and at level of umbilicus Extremities: No calf swelling or tenderness  no b/l LE edema  Recent Labs    08/21/20 0520  HGB 11.6*  HCT 33.6*    Assessment/Plan: Kaelynn Igo is a 30 y.o. 37 PPD#1 from SVD at [redacted]w[redacted]d.  Routine Postpartum Care: Doing well, pain well-controlled.  -- Continue routine care, lactation support  -- Contraception: POPs -- Feeding: breast/bottle  Dispo: Plan for discharge PPD#1.  [redacted]w[redacted]d, Medical Student 08/22/2020 7:40 AM  Attestation of Supervision of Student:  I confirm that I have verified the information documented in the medical student's note and that I have also personally reperformed the history, physical exam and all medical decision making activities.  I have verified that all services and findings are accurately documented in this student's note; and I agree with management and plan as outlined in the documentation. I have also made any necessary editorial changes.  10/20/2020, MD Center for [facility], [facility] 08/22/2020 5:37 PM

## 2021-02-03 ENCOUNTER — Ambulatory Visit (HOSPITAL_COMMUNITY)
Admission: EM | Admit: 2021-02-03 | Discharge: 2021-02-03 | Disposition: A | Discharge status: Home / Self Care | Payer: Medicaid Other | Attending: Psychiatry | Admitting: Psychiatry

## 2021-02-03 ENCOUNTER — Other Ambulatory Visit: Payer: Self-pay

## 2021-02-03 DIAGNOSIS — Z630 Problems in relationship with spouse or partner: Secondary | ICD-10-CM | POA: Insufficient documentation

## 2021-02-03 DIAGNOSIS — F4323 Adjustment disorder with mixed anxiety and depressed mood: Secondary | ICD-10-CM | POA: Insufficient documentation

## 2021-02-03 DIAGNOSIS — Z818 Family history of other mental and behavioral disorders: Secondary | ICD-10-CM | POA: Insufficient documentation

## 2021-02-03 NOTE — Discharge Summary (Signed)
Julia Costa to be D/C'd Home per MD order. Discussed with the patient and all questions fully answered. An After Visit Summary was printed and given to the patient. Patient escorted out and D/C home via private auto.  Dickie La  02/03/2021 1:07 PM

## 2021-02-03 NOTE — ED Provider Notes (Signed)
Behavioral Health Urgent Care Medical Screening Exam  Patient Name: Julia Costa MRN: [id number] Date of Evaluation: 02/03/21 Chief Complaint:   Diagnosis:  Final diagnoses:  Adjustment disorder with mixed anxiety and depressed mood    History of Present illness: Julia Costa is a 30 y.o. female with no past psychiatric history who presents to [facility] voluntarily for feeling anxious and overwhelmed. Per triage note, patient has 3 young children and was evicted from her housing and has been staying with her SIL for several months. On interview patient is calm, cooperative and pleasant. She is intermittently tearful while discussing stressors. She states that she has lost her housing, has 3 kids and that "my emotions are up and down and easily irritated". Patient reports that they were evicted from housing in February and that they have been staying with SIL since that time. Patient states that she feels that they have overstayed their welcome and expresses frustration regarding current circumstances. She reports marital conflict as an additional stressor. She states that she cares for the 3 kids during the day but also has other children that she does not see as often as she would like to. She states that she had her first child at 20 yo and that she "never got a chance to figure myself out". She states that her mother was her biggest support but that she passed away on November 14, 2015. She describes her mood as "sad, irritable, anxious" which started since losing housing. She reports depressive sx of disturbed sleep, hopelessness, decreased energy; describes appetite as "up and down" but denies unexpected changes in weight. She denies SI/HI/AVH and expresses interest in outpatient resources. Discussed therapy, couples/marital counseling, and medications; pt expressed interested in all 3. Discussed open access hours at the [facility] for medical management and therapy, SW consulted for couples/marital counseling resources,  and provided additional resource sheet for outpatient resources in the [place] area.   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 Housing Status: with husband and 3 kids at SIL house since february Easy access to gun: no  Substance Use (with emphasis over the last 12 months) Recreational Drugs: occasional marijuana; has used delta 8 in the past but denies regular use Use of Alcohol: denied   Legal History: Past Charges/Incarcerations: denied Pending charges: denied  Family Psychiatric History: Aunt- bipolar and schizophrenia   Psychiatric Specialty Exam  Presentation  General Appearance:Appropriate for Environment; Casual Eye Contact:Good Speech:Clear and Coherent; Normal Rate Speech Volume:Normal Handedness:No data recorded  Mood and Affect  Mood: Anxious; Dysphoric Affect: Tearful; Appropriate; Congruent (appropraitely tearful at times when discussing stressors)  Thought Process  Thought Processes: Coherent; Goal Directed; Linear Descriptions of Associations:Intact Orientation:Full (Time, Place and Person) Thought Content:WDL; Logical   Hallucinations:None Ideas of Reference:None Suicidal Thoughts:No Homicidal Thoughts:No  Sensorium  Memory: Immediate Good; Recent Good; Remote Good Judgment: Good Insight: Good  Executive Functions  Concentration: Good Attention Span: Good Recall: Good Fund of Knowledge: Good Language: Good  Psychomotor Activity  Psychomotor Activity: Normal  Assets  Assets: Desire for Improvement; Communication Skills; Physical Health; Resilience  Sleep  Sleep: Fair Number of hours:  No  data recorded  No data recorded  Physical Exam: Physical Exam Constitutional:  Appearance: Normal appearance. She is normal weight.  HENT:     Head: Normocephalic and atraumatic.  Pulmonary:     Effort: Pulmonary effort is normal.  Neurological:     Mental Status: She is alert and oriented to person, place, and time.   Review of Systems  Constitutional:  Negative for chills and fever.  HENT:  Negative for hearing loss.   Eyes:  Negative for discharge and redness.  Respiratory:  Negative for cough.   Cardiovascular:  Negative for chest pain.  Gastrointestinal:  Negative for abdominal pain.  Musculoskeletal:  Negative for myalgias.  Neurological:  Negative for headaches.  Psychiatric/Behavioral:  Positive for depression. Negative for hallucinations and suicidal ideas. The patient is nervous/anxious.   Blood pressure 117/84, pulse 78, temperature 97.7 F (36.5 C), temperature source Oral, resp. rate 18, height 5\' 6"  (1.676 m), weight 74.4 kg, SpO2 99 %, unknown if currently breastfeeding. Body mass index is 26.47 kg/m.  Musculoskeletal: Strength & Muscle Tone: within normal limits Gait & Station: normal Patient leans: N/A   [facility] MSE Discharge Disposition for Follow up and Recommendations: Based on my evaluation the patient does not appear to have an emergency medical condition and can be discharged with resources and follow up care in outpatient services for Medication Management, Individual Therapy, and couples therapy/marital counseling  Patient has been instructed & cautioned and was agreeable to the followoing:  In the event of worsening symptoms, patient is instructed to call the crisis hotline, 911 and or go to the nearest ED for appropriate evaluation and treatment of symptoms.      , MD 02/03/2021, 12:50 PM

## 2021-02-03 NOTE — Discharge Instructions (Signed)
   Please come to [facility] (this facility) during walk in hours for appointment with psychiatrist for further medication management and for therapists for therapy.    Walk in hours are 8-11 AM Monday through Thursday for medication management. Therapy walk in hours are Monday-Wednesday 8 AM-1PM.   It is first come, first -serve; it is best to arrive by 7:00 AM.   On Friday from 1 pm to 4 pm for therapy intake only. Please arrive by 12:00 pm as it is  first come, first -serve.    When you arrive please go upstairs for your appointment. If you are unsure of where to go, inform the front desk that you are here for a walk in appointment and they will assist you with directions upstairs.  Address:  [address], in [place], [address] Ph: [phone]   

## 2021-02-03 NOTE — BH Assessment (Signed)
Triage Note- ROUTINE- Feeling anxious and overwhelmed. Has a 77 mth old baby, 30 yo and 81 yo. They got evicted from apt in March & have been staying on sofa at [facility] home. Pt's mother died in 11/19/2015- was pt's support & friend. Pt tearful. She denies SI, HI & AVH- feels like running away sometimes. Partner works at [employer]- not making enough money to get housing.

## 2021-02-04 ENCOUNTER — Inpatient Hospital Stay (HOSPITAL_COMMUNITY)
Admission: AD | Admit: 2021-02-04 | Discharge: 2021-02-04 | Disposition: A | Discharge status: Home / Self Care | Payer: Medicaid Other | Attending: Obstetrics and Gynecology | Admitting: Obstetrics and Gynecology

## 2021-02-04 DIAGNOSIS — R4589 Other symptoms and signs involving emotional state: Secondary | ICD-10-CM | POA: Diagnosis not present

## 2021-02-04 DIAGNOSIS — F32A Depression, unspecified: Secondary | ICD-10-CM

## 2021-02-04 DIAGNOSIS — Z3202 Encounter for pregnancy test, result negative: Secondary | ICD-10-CM

## 2021-02-04 DIAGNOSIS — R1010 Upper abdominal pain, unspecified: Secondary | ICD-10-CM | POA: Diagnosis not present

## 2021-02-04 LAB — POCT PREGNANCY, URINE: Preg Test, Ur: NEGATIVE

## 2021-02-04 NOTE — MAU Provider Note (Signed)
Event Date/Time   First Provider Initiated Contact with Patient 02/04/21 1839      S Julia Costa is a 30 y.o. [phone] patient who presents to MAU today with complaint of upper abdominal pain x2 days, feelings of sadness, irritability, intermittent crying, being overwhelmed and not being as responsive when her baby cries as much as she would like. Patient was seen by [facility] yesterday and given community resources. Patients reports she has encountered insurance issues and also states that she called one of the recommended services, but did not like they they only did therapy and not medication management. Patient denies SI/HI. Patient states she does not want to be pregnant at this time, but has not been good about taking her prescribed OCPs. Patient states she has tried Depo and IUD in the past and they made her mood worse, so she does not wish to try those again at this time. Patient states she came to the hospital today because she was told to come here by [facility] if her feelings persisted.  O BP 128/89   Pulse 97   Temp 98 F (36.7 C)   Resp 18   LMP 12/18/2020 Comment: missed doses of BCP had some spotting  Physical Exam Vitals and nursing note reviewed.  Constitutional:      General: She is not in acute distress.    Appearance: Normal appearance. She is not ill-appearing, toxic-appearing or diaphoretic.  HENT:     Head: Normocephalic and atraumatic.  Pulmonary:     Effort: Pulmonary effort is normal.  Neurological:     Mental Status: She is alert and oriented to person, place, and time.  Psychiatric:        Behavior: Behavior normal.        Thought Content: Thought content normal.        Judgment: Judgment normal.   A Medical screening exam started UPT negative  P Discharge from MAU in stable condition Patient transferred to [facility] for further evaluation Report called to [place], [place] in [facility] Warning signs for worsening condition that would warrant emergency follow-up  discussed Patient may return to MAU as needed   Chadwin Fury, Odie Sera, NP 02/04/2021 6:53 PM

## 2021-02-04 NOTE — MAU Note (Signed)
Tina RN CN in [facility] ED notified of pt's pending transfer to ED. RN in MAU went to tell pt we would have someone walk with her to the ED and pt was no longer in the [facility] and not in the lobby.

## 2021-02-04 NOTE — MAU Note (Signed)
Pt delivered in Feb.2022. She lost her housing soon after that and is living with family. Feels overwhelmed and sad. Was seen yesterday at [facility].  Tried to schedule conseling that she was referred to but insurance needs to be changed or reapplied for a different kind of medicaid.Pt denies wanting to harm self or her children but when baby cries she gets more depressed and overwhelmed.  C/o  upper abd /epigastric pain x 2 days

## 2021-07-06 ENCOUNTER — Other Ambulatory Visit: Payer: Self-pay

## 2021-07-06 ENCOUNTER — Inpatient Hospital Stay (HOSPITAL_COMMUNITY): Payer: Medicaid Other

## 2021-07-06 ENCOUNTER — Encounter (HOSPITAL_COMMUNITY): Payer: Self-pay | Admitting: Obstetrics and Gynecology

## 2021-07-06 ENCOUNTER — Inpatient Hospital Stay (HOSPITAL_COMMUNITY)
Admission: AD | Admit: 2021-07-06 | Discharge: 2021-07-06 | Disposition: A | Discharge status: Home / Self Care | Payer: Medicaid Other | Attending: Obstetrics and Gynecology | Admitting: Obstetrics and Gynecology

## 2021-07-06 DIAGNOSIS — O210 Mild hyperemesis gravidarum: Secondary | ICD-10-CM | POA: Insufficient documentation

## 2021-07-06 DIAGNOSIS — O208 Other hemorrhage in early pregnancy: Secondary | ICD-10-CM | POA: Diagnosis not present

## 2021-07-06 DIAGNOSIS — O219 Vomiting of pregnancy, unspecified: Secondary | ICD-10-CM | POA: Diagnosis not present

## 2021-07-06 DIAGNOSIS — R109 Unspecified abdominal pain: Secondary | ICD-10-CM | POA: Diagnosis not present

## 2021-07-06 DIAGNOSIS — O26899 Other specified pregnancy related conditions, unspecified trimester: Secondary | ICD-10-CM | POA: Diagnosis not present

## 2021-07-06 DIAGNOSIS — Z3A01 Less than 8 weeks gestation of pregnancy: Secondary | ICD-10-CM | POA: Insufficient documentation

## 2021-07-06 DIAGNOSIS — Z3491 Encounter for supervision of normal pregnancy, unspecified, first trimester: Secondary | ICD-10-CM

## 2021-07-06 LAB — URINALYSIS, ROUTINE W REFLEX MICROSCOPIC
Bacteria, UA: NONE SEEN
Bilirubin Urine: NEGATIVE
Glucose, UA: NEGATIVE mg/dL
Ketones, ur: NEGATIVE mg/dL
Leukocytes,Ua: NEGATIVE
Nitrite: NEGATIVE
Protein, ur: 30 mg/dL — AB
Specific Gravity, Urine: 1.014 (ref 1.005–1.030)
pH: 7 (ref 5.0–8.0)

## 2021-07-06 LAB — CBC
HCT: 38.6 % (ref 36.0–46.0)
Hemoglobin: 13.3 g/dL (ref 12.0–15.0)
MCH: 31 pg (ref 26.0–34.0)
MCHC: 34.5 g/dL (ref 30.0–36.0)
MCV: 90 fL (ref 80.0–100.0)
Platelets: 240 10*3/uL (ref 150–400)
RBC: 4.29 MIL/uL (ref 3.87–5.11)
RDW: 12.7 % (ref 11.5–15.5)
WBC: 7.9 10*3/uL (ref 4.0–10.5)
nRBC: 0 % (ref 0.0–0.2)

## 2021-07-06 LAB — WET PREP, GENITAL
Clue Cells Wet Prep HPF POC: NONE SEEN
Sperm: NONE SEEN
Trich, Wet Prep: NONE SEEN
WBC, Wet Prep HPF POC: <10 (ref <10)
Yeast Wet Prep HPF POC: NONE SEEN

## 2021-07-06 LAB — HCG, QUANTITATIVE, PREGNANCY: hCG, Beta Chain, Quant, S: 184920 m[IU]/mL — ABNORMAL HIGH (ref <5)

## 2021-07-06 MED ORDER — METOCLOPRAMIDE HCL 10 MG PO TABS
10.0000 mg | ORAL_TABLET | Freq: Once | ORAL | Status: AC
  Administered 2021-07-06: 11:00:00 10 mg via ORAL
  Filled 2021-07-06: qty 1

## 2021-07-06 NOTE — MAU Note (Signed)
Presents with c/o abdominal cramping that began this morning.  Denies VB.  LMP approximately 06/07/2021.

## 2021-07-06 NOTE — MAU Note (Signed)
Patient Reports cramping that started this morning 07/06/21 at 0800.  Called office and did not want to wait for appt later today. Patient has not taken any medications for cramping at this time.  Patient also indicates she has had nausea since she found out she was pregnant.

## 2021-07-06 NOTE — Discharge Instructions (Signed)
Safe Medications in Pregnancy   Acne: Benzoyl Peroxide Salicylic Acid  Backache/Headache: Tylenol: 2 regular strength every 4 hours OR              2 Extra strength every 6 hours  Colds/Coughs/Allergies: Benadryl (alcohol free) 25 mg every 6 hours as needed Breath right strips Claritin Cepacol throat lozenges Chloraseptic throat spray Cold-Eeze- up to three times per day Cough drops, alcohol free Flonase (by prescription only) Guaifenesin Mucinex Robitussin DM (plain only, alcohol free) Saline nasal spray/drops Sudafed (pseudoephedrine) & Actifed ** use only after [redacted] weeks gestation and if you do not have high blood pressure Tylenol Vicks Vaporub Zinc lozenges Zyrtec   Constipation: Colace Ducolax suppositories Fleet enema Glycerin suppositories Metamucil Milk of magnesia Miralax Senokot Smooth move tea  Diarrhea: Kaopectate Imodium A-D  *NO pepto Bismol  Hemorrhoids: Anusol Anusol HC Preparation H Tucks  Indigestion: Tums Maalox Mylanta Zantac  Pepcid  Insomnia: Benadryl (alcohol free) 25mg  every 6 hours as needed Tylenol PM Unisom, no Gelcaps  Leg Cramps: Tums MagGel  Nausea/Vomiting:  Bonine Dramamine Emetrol Ginger extract Sea bands Meclizine  Nausea medication to take during pregnancy:  Unisom (doxylamine succinate 25 mg tablets) Take one tablet daily at bedtime. If symptoms are not adequately controlled, the dose can be increased to a maximum recommended dose of two tablets daily (1/2 tablet in the morning, 1/2 tablet mid-afternoon and one at bedtime). Vitamin B6 100mg  tablets. Take one tablet twice a day (up to 200 mg per day).  Skin Rashes: Aveeno products Benadryl cream or 25mg  every 6 hours as needed Calamine Lotion 1% cortisone cream  Yeast infection: Gyne-lotrimin 7 Monistat 7  Gum/tooth pain: Anbesol  **If taking multiple medications, please check labels to avoid duplicating the same active ingredients **take  medication as directed on the label ** Do not exceed 4000 mg of tylenol in 24 hours **Do not take medications that contain aspirin or ibuprofen     Return to care  If you have heavier bleeding that soaks through more than 2 pads per hour for an hour or more If you bleed so much that you feel like you might pass out or you do pass out If you have significant abdominal pain that is not improved with Tylenol

## 2021-07-06 NOTE — MAU Provider Note (Signed)
History     CSN: [id number]  Arrival date and time: 07/06/21 [id number]   Event Date/Time   First Provider Initiated Contact with Patient 07/06/21 1035      Chief Complaint  Patient presents with   Cramping   HPI Julia Costa is a 30 y.o. [id number] at [redacted]w[redacted]d by unsure LMP who presents with abdominal pain & nausea. Reports intermittent cramping in her suprapubic area. Last felt pain this morning. Rates pain 6/10 when it occurs. Hasn't treated symptoms. Reports some pressure & urgency with voiding this morning. Denies dysuria, hematuria, or increased frequency.  Has had n/v since finding out the was pregnant. Vomits 3-4 times per day. Was prescribed zofran by her ob which helps when she takes it. Took it yesterday.  Last BM was 2 days ago.  Denies fever, diarrhea, vaginal bleeding, or abnormal discharge.  Gets care in [facility] with [facility].   OB History     Gravida  10   Para  6   Term  6   Preterm      AB  3   Living  6      SAB  1   IAB  2   Ectopic      Multiple  0   Live Births  6           Past Medical History:  Diagnosis Date   GERD (gastroesophageal reflux disease)    Pregnancy induced hypertension    with her first pregnancy   Short interval between pregnancies affecting pregnancy, antepartum 08/08/2018   SVD 10/2017   Unwanted fertility 09/19/2018    Past Surgical History:  Procedure Laterality Date   termination of pregnancy      Family History  Problem Relation Age of Onset   Alcohol abuse Neg Hx    Anxiety disorder Neg Hx    Arthritis Neg Hx    Asthma Neg Hx    Birth defects Neg Hx    Cancer Neg Hx    COPD Neg Hx    Depression Neg Hx    Diabetes Neg Hx    Early death Neg Hx    Drug abuse Neg Hx    Hearing loss Neg Hx    Heart disease Neg Hx    Hypertension Neg Hx    Hyperlipidemia Neg Hx    Intellectual disability Neg Hx    Kidney disease Neg Hx    Learning disabilities Neg Hx    Miscarriages / Stillbirths Neg Hx     Obesity Neg Hx    Stroke Neg Hx    Vision loss Neg Hx    Varicose Veins Neg Hx    ADD / ADHD Neg Hx     Social History   Tobacco Use   Smoking status: Every Day    Packs/day: 0.25    Years: 4.00    Pack years: 1.00    Types: Cigarettes   Smokeless tobacco: Never  Vaping Use   Vaping Use: Never used  Substance Use Topics   Alcohol use: No   Drug use: No    Allergies: No Known Allergies  No medications prior to admission.    Review of Systems  Constitutional: Negative.   Gastrointestinal:  Positive for abdominal pain, constipation, nausea and vomiting. Negative for diarrhea.  Genitourinary:  Positive for urgency. Negative for dysuria, flank pain, frequency, hematuria, vaginal bleeding and vaginal discharge.  Physical Exam   Blood pressure 120/68, pulse 69, temperature 97.7 F (36.5 C),  temperature source Oral, resp. rate 20, height 5\' 4"  (1.626 m), weight 70 kg, last menstrual period 06/08/2021, SpO2 99 %, unknown if currently breastfeeding.  Physical Exam Vitals and nursing note reviewed.  Constitutional:      General: She is not in acute distress.    Appearance: Normal appearance. She is not ill-appearing.  HENT:     Head: Normocephalic and atraumatic.  Eyes:     General: No scleral icterus.    Conjunctiva/sclera: Conjunctivae normal.     Pupils: Pupils are equal, round, and reactive to light.  Pulmonary:     Effort: Pulmonary effort is normal. No respiratory distress.  Abdominal:     General: Abdomen is flat.     Palpations: Abdomen is soft.     Tenderness: There is no abdominal tenderness. There is no guarding or rebound.  Skin:    General: Skin is warm and dry.  Neurological:     General: No focal deficit present.     Mental Status: She is alert.  Psychiatric:        Mood and Affect: Mood normal.        Behavior: Behavior normal.    MAU Course  Procedures Results for orders placed or performed during the hospital encounter of 07/06/21 (from the  past 24 hour(s))  CBC     Status: None   Collection Time: 07/06/21 10:27 AM  Result Value Ref Range   WBC 7.9 4.0 - 10.5 K/uL   RBC 4.29 3.87 - 5.11 MIL/uL   Hemoglobin 13.3 12.0 - 15.0 g/dL   HCT 07/08/21 63.8 - 45.3 %   MCV 90.0 80.0 - 100.0 fL   MCH 31.0 26.0 - 34.0 pg   MCHC 34.5 30.0 - 36.0 g/dL   RDW 64.6 80.3 - 21.2 %   Platelets 240 150 - 400 K/uL   nRBC 0.0 0.0 - 0.2 %  hCG, quantitative, pregnancy     Status: Abnormal   Collection Time: 07/06/21 10:27 AM  Result Value Ref Range   hCG, Beta Chain, Quant, S 184,920 (H) <5 mIU/mL  Urinalysis, Routine w reflex microscopic Urine, Clean Catch     Status: Abnormal   Collection Time: 07/06/21 10:34 AM  Result Value Ref Range   Color, Urine YELLOW YELLOW   APPearance HAZY (A) CLEAR   Specific Gravity, Urine 1.014 1.005 - 1.030   pH 7.0 5.0 - 8.0   Glucose, UA NEGATIVE NEGATIVE mg/dL   Hgb urine dipstick MODERATE (A) NEGATIVE   Bilirubin Urine NEGATIVE NEGATIVE   Ketones, ur NEGATIVE NEGATIVE mg/dL   Protein, ur 30 (A) NEGATIVE mg/dL   Nitrite NEGATIVE NEGATIVE   Leukocytes,Ua NEGATIVE NEGATIVE   RBC / HPF 11-20 0 - 5 RBC/hpf   WBC, UA 0-5 0 - 5 WBC/hpf   Bacteria, UA NONE SEEN NONE SEEN   Squamous Epithelial / LPF 6-10 0 - 5   Mucus PRESENT   Wet prep, genital     Status: None   Collection Time: 07/06/21 10:47 AM   Specimen: Vaginal  Result Value Ref Range   Yeast Wet Prep HPF POC NONE SEEN NONE SEEN   Trich, Wet Prep NONE SEEN NONE SEEN   Clue Cells Wet Prep HPF POC NONE SEEN NONE SEEN   WBC, Wet Prep HPF POC <10 <10   Sperm NONE SEEN    07/08/21 OB LESS THAN 14 WEEKS WITH OB TRANSVAGINAL  Result Date: 07/06/2021 CLINICAL DATA:  Abdominal cramping EXAM: OBSTETRIC <14 WK 07/08/2021 AND TRANSVAGINAL  OB US TECHNIQUE: Both transabdominal and transvaginal ultrasound examinations were performed for complete evaluation of the gestation as well as the maternal uterus, adnexal regions, and pelvic cul-de-sac. Transvaginal technique was  performed to assess early pregnancy. COMPARISON:  Prior obstetric ultrasound dated 01/13/2020 FINDINGS: Intrauterine gestational sac: Single Yolk sac:  Visualized. Embryo:  Visualized. Cardiac Activity: Visualized. Heart Rate: 114 bpm CRL: 4.9 mm   6 w   1 d                  [place] EDC: 02/28/2022 Subchorionic hemorrhage:  A small subchorionic hemorrhage is seen. Maternal uterus/adnexae: A corpus luteum is seen on the right. The ovaries are otherwise unremarkable. IMPRESSION: 1. Single live intrauterine pregnancy with an estimated gestational age of [redacted] weeks 1 day by crown-rump length. 2. Small subchorionic hemorrhage. Electronically Signed   By: Valetta Mole M.D.   On: 07/06/2021 12:11    MDM +UPT UA, wet prep, GC/chlamydia, CBC, ABO/Rh, quant hCG, and [place] today to rule out ectopic pregnancy which can be life threatening.   Given reglan PO while in MAU. Reports some improvement. Would like to continue zofran at home. Discussed treatment of constipation & to call her ob for alternate treatment if constipation not improved.   Ultrasound shows live IUP measuring [redacted]w[redacted]d, EDD updated. Also shows small subchorionic hemorrhage. Patient has no bleeding currently. Discussed results with patient & possibility of bleeding in the future as well as return precautions. She is RH positive.   Assessment and Plan   1. Nausea and vomiting during pregnancy prior to [redacted] weeks gestation  -continue zofran as prescribed by ob  2. Abdominal pain affecting pregnancy  -gc/ct pending -reviewed return precautions  3. Normal IUP (intrauterine pregnancy) on prenatal ultrasound, first trimester   4. [redacted] weeks gestation of pregnancy      Jorje Guild 07/06/2021, 12:40 PM

## 2021-07-07 LAB — GC/CHLAMYDIA PROBE AMP (~~LOC~~) NOT AT ARMC
Chlamydia: NEGATIVE
Comment: NEGATIVE
Comment: NORMAL
Neisseria Gonorrhea: NEGATIVE

## 2022-02-15 IMAGING — US US OB < 14 WEEKS - US OB TV
1 series · 15 of 28 positions shown · non-contrast
Comparison: Prior obstetric ultrasound dated 01/13/2020

CLINICAL DATA: Abdominal cramping

EXAM:
OBSTETRIC <14 WK US AND TRANSVAGINAL OB US
TECHNIQUE: Both transabdominal and transvaginal ultrasound examinations were
performed for complete evaluation of the gestation as well as the
maternal uterus, adnexal regions, and pelvic cul-de-sac.
Transvaginal technique was performed to assess early pregnancy.

[Series 1: us ob < 14 weeks - us ob tv · 15 of 100 slices shown]
[im 1/100]
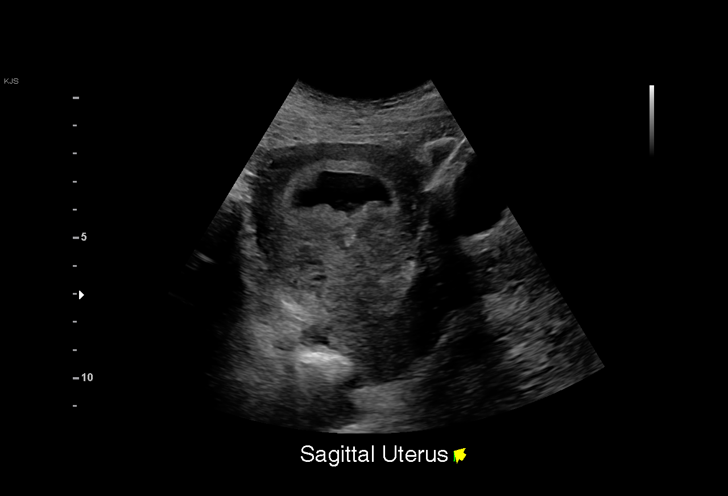
[im 8/100]
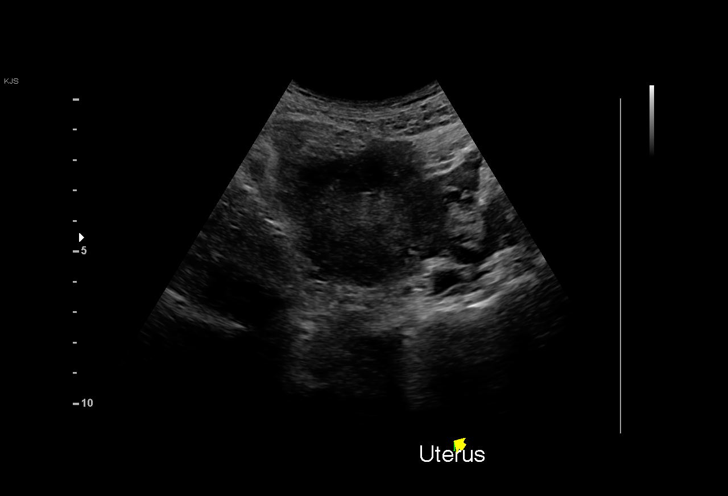
[im 15/100]
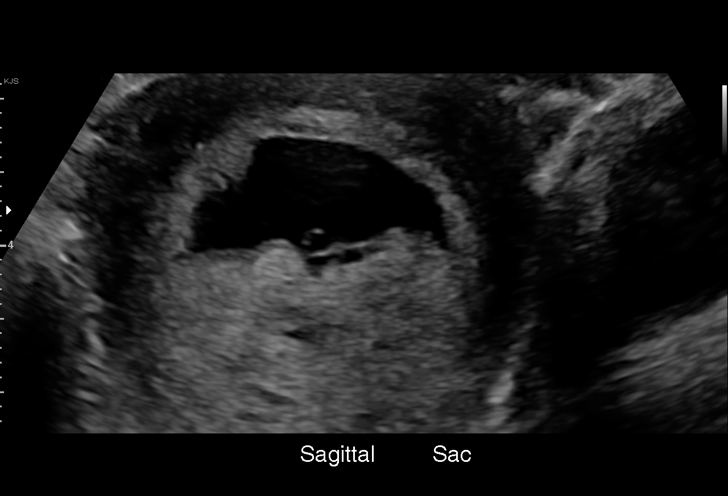
[im 23/100]
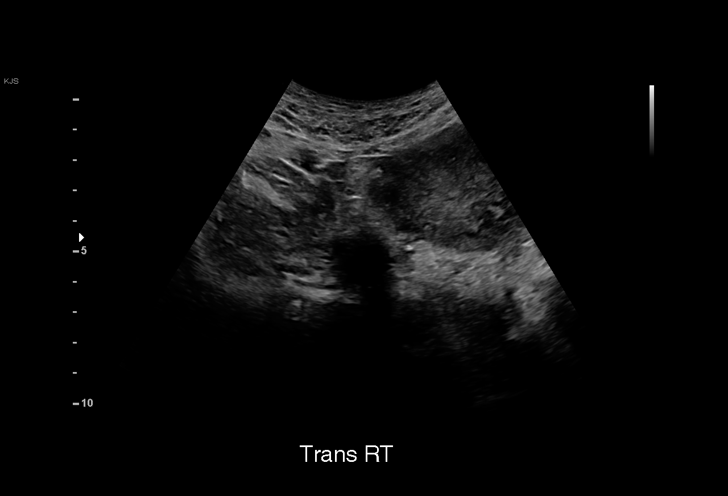
[im 30/100]
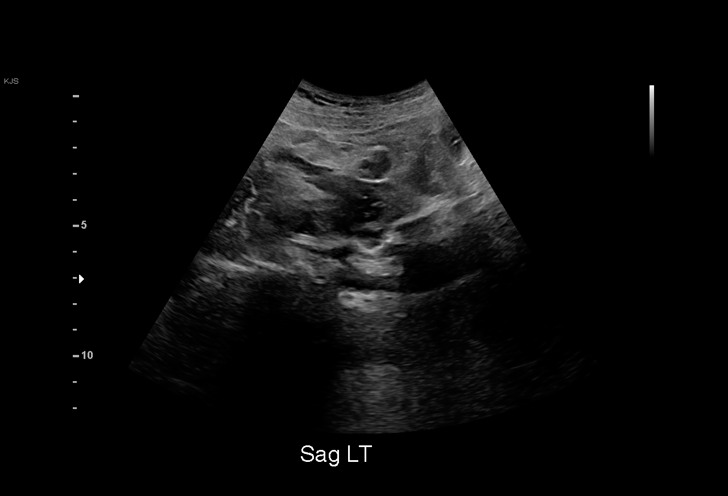
[im 37/100]
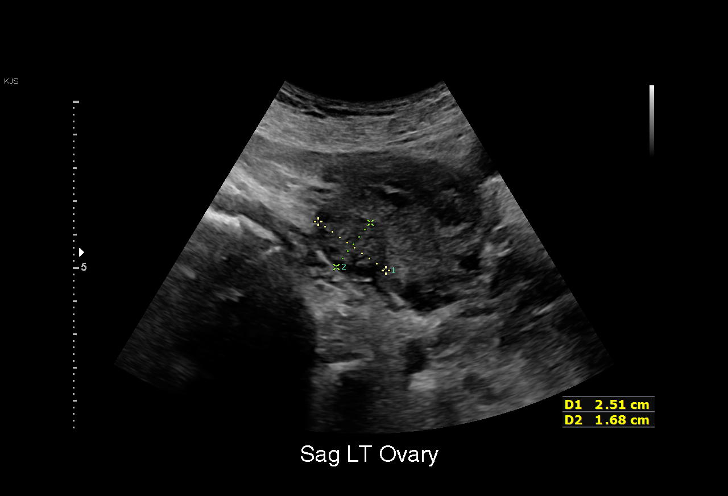
[im 45/100]
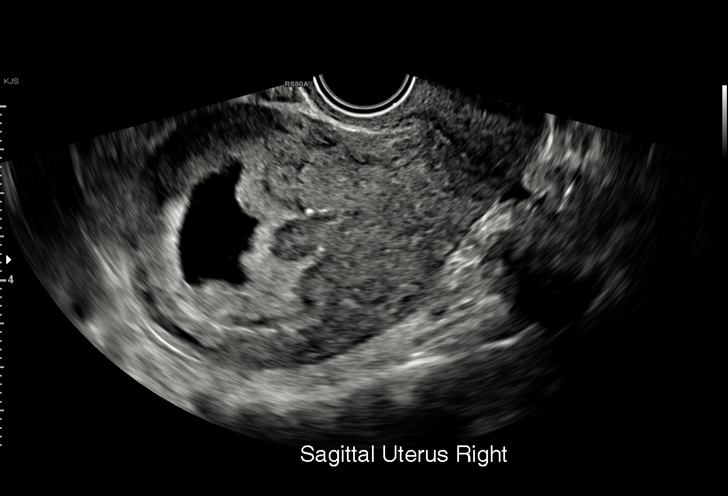
[im 52/100]
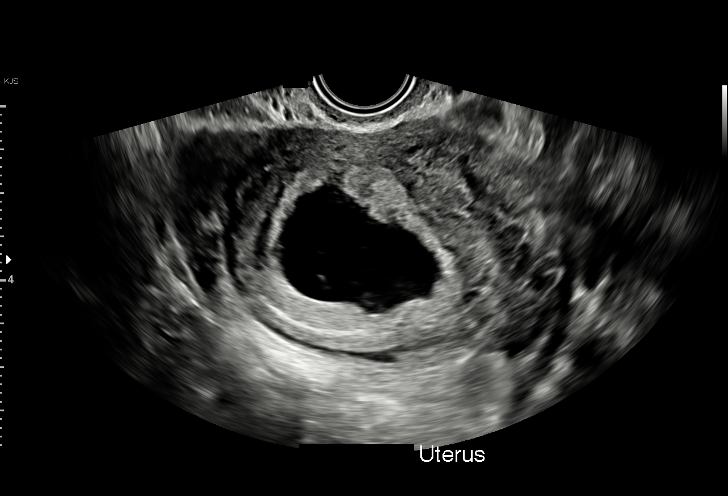
[im 56/100]
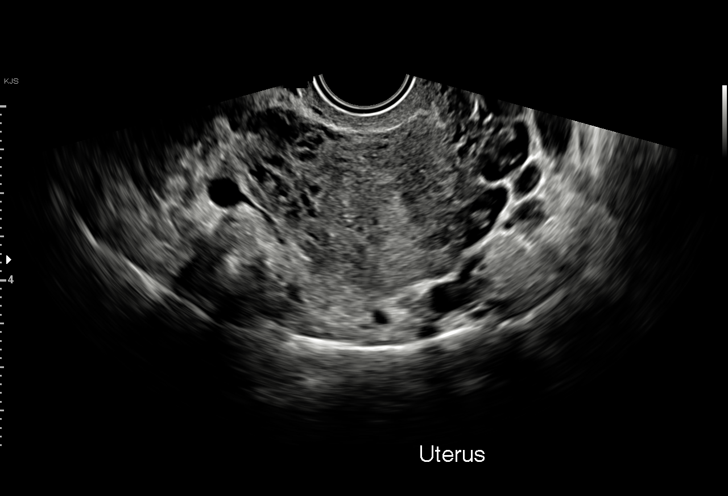
[im 63/100]
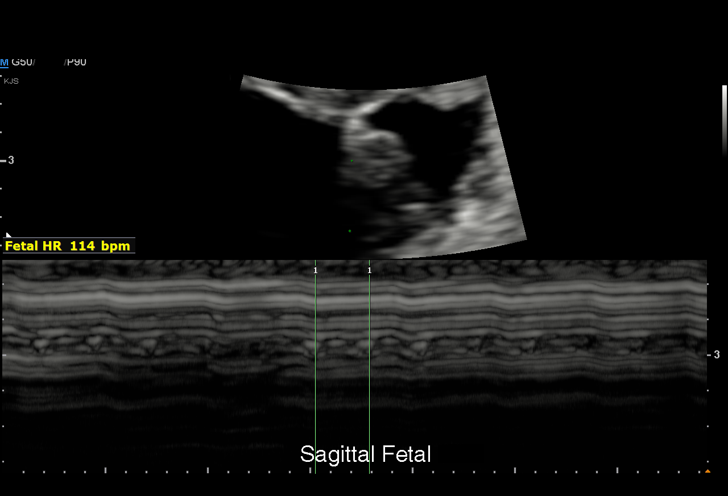
[im 70/100]
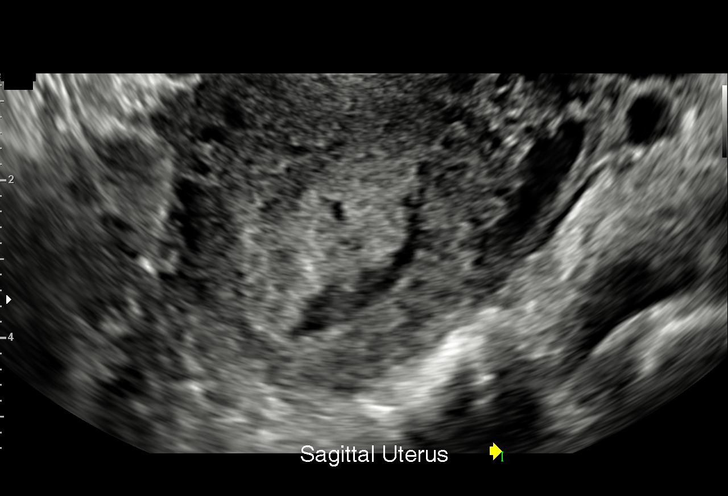
[im 78/100]
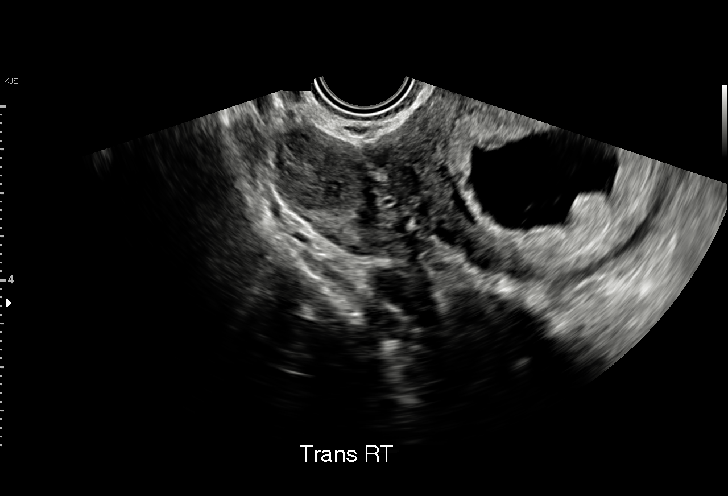
[im 85/100]
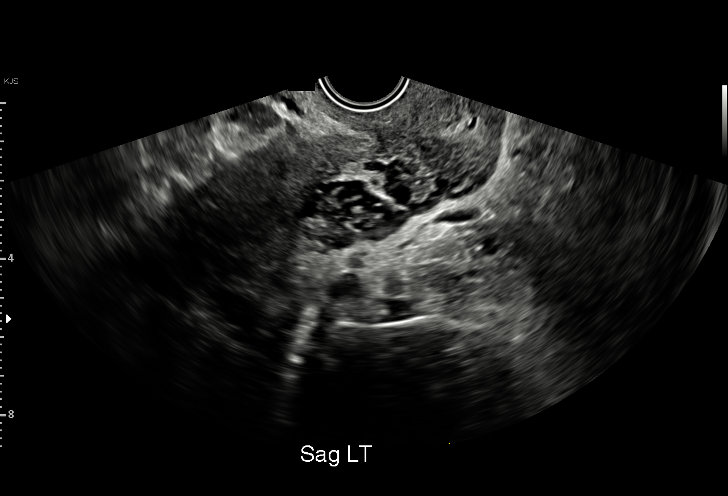
[im 92/100]
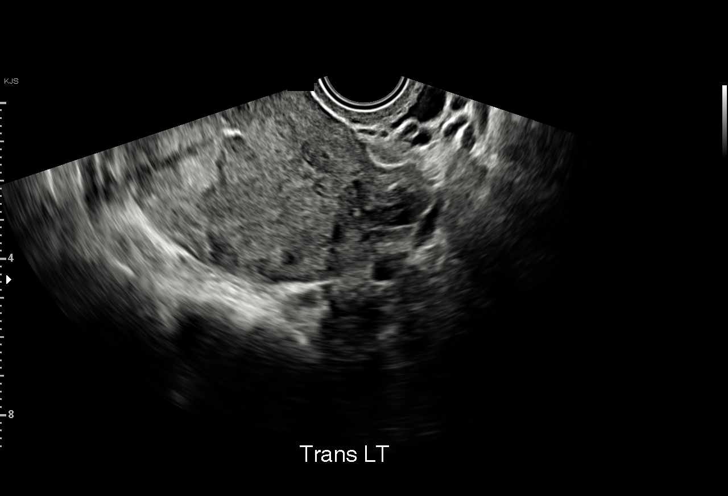
[im 100/100]
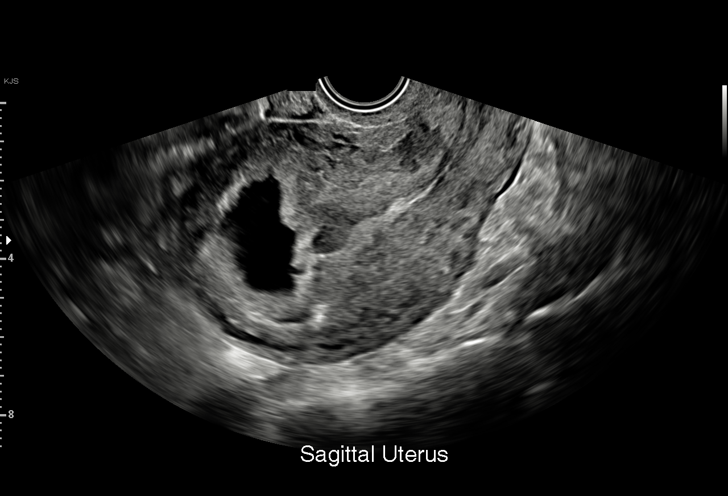

[15 of 28 positions shown; findings below may reference images not displayed]

FINDINGS: Intrauterine gestational sac: Single

Yolk sac:  Visualized.

Embryo:  Visualized.

Cardiac Activity: Visualized.

Heart Rate: 114 bpm

CRL: 4.9 mm   6 w   1 d                  US EDC: 02/28/2022

Subchorionic hemorrhage:  A small subchorionic hemorrhage is seen.

Maternal uterus/adnexae: A corpus luteum is seen on the right. The
ovaries are otherwise unremarkable.
IMPRESSION: 1. Single live intrauterine pregnancy with an estimated gestational
age of 6 weeks 1 day by crown-rump length.
2. Small subchorionic hemorrhage.

## 2022-04-19 ENCOUNTER — Inpatient Hospital Stay (HOSPITAL_COMMUNITY)
Admission: AD | Admit: 2022-04-19 | Discharge: 2022-04-19 | Discharge status: Against Medical Advice | Payer: Medicaid Other | Attending: Obstetrics and Gynecology | Admitting: Obstetrics and Gynecology

## 2022-04-19 ENCOUNTER — Encounter (HOSPITAL_COMMUNITY): Payer: Self-pay | Admitting: Obstetrics and Gynecology

## 2022-04-19 DIAGNOSIS — R109 Unspecified abdominal pain: Secondary | ICD-10-CM | POA: Diagnosis present

## 2022-04-19 LAB — POCT PREGNANCY, URINE: Preg Test, Ur: NEGATIVE

## 2022-04-19 NOTE — MAU Note (Signed)
Not in lobby

## 2022-04-19 NOTE — MAU Note (Addendum)
.  Julia Costa is a 31 y.o. here reporting bad abd cramps since beginning of wkend. Had BTL 03/25/22 at [place]. SVD 7/26 at [facility]. Denies VB  LMP: late August. No meds for the pain. Was visiting her father who is close by is why she came to MAU Onset of complaint: beginning of wkedn Pain score: 10 Vitals:   04/19/22 1933  BP: 134/79  Pulse: 89  Temp: 98.1 F (36.7 C)  SpO2: 100%     FHT:n/a Lab orders placed from triage: upt

## 2022-04-19 NOTE — MAU Provider Note (Signed)
None     S Ms. Julia Costa is a 31 y.o. [id number] patient who presents to MAU today with complaint of abdominal pain. She is status post delivery in July   I went out to waiting room to tell her we would not be able to treat her here due to being too far postpartum but she was not found in waiting room. .   O BP 134/79 (BP Location: Right Arm)   Pulse 89   Temp 98.1 F (36.7 C)   Ht 5\' 4"  (1.626 m)   Wt 87.1 kg   LMP  (LMP Unknown) Comment: last period end of August per pt  SpO2 100%   Breastfeeding Unknown   BMI 32.96 kg/m  Physical Exam  A Medical screening exam  not able to be completed   P Unable to evaluate.     Seabron Spates, CNM 04/19/2022 9:24 PM
